# Patient Record
Sex: Female | Born: 1950 | Race: Black or African American | Hispanic: No | Marital: Married | Smoking: Never smoker
Health system: Southern US, Community
[De-identification: ages and names within clinical notes are randomized; demographics above are authoritative.]

## PROBLEM LIST (undated history)

## (undated) DIAGNOSIS — G4730 Sleep apnea, unspecified: Secondary | ICD-10-CM

## (undated) HISTORY — PX: APPENDECTOMY: SHX54

## (undated) HISTORY — PX: ABDOMINAL HYSTERECTOMY: SHX81

---

## 2007-01-02 ENCOUNTER — Encounter: Admission: RE | Admit: 2007-01-02 | Discharge: 2007-01-02 | Payer: Self-pay | Admitting: Internal Medicine

## 2008-02-19 ENCOUNTER — Encounter: Admission: RE | Admit: 2008-02-19 | Discharge: 2008-02-19 | Payer: Self-pay | Admitting: Internal Medicine

## 2009-03-02 ENCOUNTER — Encounter: Admission: RE | Admit: 2009-03-02 | Discharge: 2009-03-02 | Payer: Self-pay | Admitting: Internal Medicine

## 2010-03-15 ENCOUNTER — Encounter: Admission: RE | Admit: 2010-03-15 | Discharge: 2010-03-15 | Payer: Self-pay | Admitting: Internal Medicine

## 2011-02-12 ENCOUNTER — Other Ambulatory Visit: Payer: Self-pay | Admitting: Internal Medicine

## 2011-02-12 DIAGNOSIS — Z1231 Encounter for screening mammogram for malignant neoplasm of breast: Secondary | ICD-10-CM

## 2011-03-19 ENCOUNTER — Ambulatory Visit
Admission: RE | Admit: 2011-03-19 | Discharge: 2011-03-19 | Disposition: A | Discharge status: Home / Self Care | Payer: PRIVATE HEALTH INSURANCE | Source: Ambulatory Visit | Attending: Internal Medicine | Admitting: Internal Medicine

## 2011-03-19 DIAGNOSIS — Z1231 Encounter for screening mammogram for malignant neoplasm of breast: Secondary | ICD-10-CM

## 2011-11-12 ENCOUNTER — Emergency Department (HOSPITAL_COMMUNITY)
Admission: EM | Admit: 2011-11-12 | Discharge: 2011-11-12 | Disposition: A | Discharge status: Home / Self Care | Payer: PRIVATE HEALTH INSURANCE | Attending: Emergency Medicine | Admitting: Emergency Medicine

## 2011-11-12 ENCOUNTER — Encounter (HOSPITAL_COMMUNITY): Payer: Self-pay | Admitting: Emergency Medicine

## 2011-11-12 DIAGNOSIS — K644 Residual hemorrhoidal skin tags: Secondary | ICD-10-CM | POA: Insufficient documentation

## 2011-11-12 DIAGNOSIS — K649 Unspecified hemorrhoids: Secondary | ICD-10-CM

## 2011-11-12 DIAGNOSIS — G4730 Sleep apnea, unspecified: Secondary | ICD-10-CM | POA: Insufficient documentation

## 2011-11-12 DIAGNOSIS — K625 Hemorrhage of anus and rectum: Secondary | ICD-10-CM | POA: Insufficient documentation

## 2011-11-12 HISTORY — DX: Sleep apnea, unspecified: G47.30

## 2011-11-12 LAB — OCCULT BLOOD, POC DEVICE: Fecal Occult Bld: POSITIVE

## 2011-11-12 MED ORDER — LIDOCAINE-HYDROCORTISONE ACE 3-0.5 % RE CREA: TOPICAL_CREAM | RECTAL | Status: DC

## 2011-11-12 MED ORDER — HYDROCORTISONE ACE-PRAMOXINE 1-1 % RE FOAM: 1.0000 | Freq: Two times a day (BID) | RECTAL | Status: AC

## 2011-11-12 NOTE — ED Notes (Signed)
Patient complaining of rectal bleeding for the past to the three days; patient states that she has never had this problem in the past.  Patient reports color of blood as bright red and that she passed a couple of blood clots tonight (quarter-sized).  Patient denies being on any blood thinners at home.  Patient reports having constipation along with the rectal bleeding (reports hard, firm stools); last bowel movement two days ago.  Patient rates rectal pain 7/10 on the numerical pain scale; describes pain as "throbbing"; denies any abdominal pain.  Upon inspection, bowel sounds are present in all four quadrants.  Patient alert and oriented x4; PERRL present.  Upon arrival to room, patient changed into gown and connected to continuous cardiac, pulse ox, and blood pressure monitor.  Family present at bedside.  Will continue to monitor.

## 2011-11-12 NOTE — ED Notes (Signed)
C/o rectal bleeding with blood clots since Friday.  Reports mild rectal pain.

## 2011-11-12 NOTE — ED Provider Notes (Signed)
History     CSN: [id number]  Arrival date & time 11/12/11  0006   First MD Initiated Contact with Patient 11/12/11 [phone]      Chief Complaint  Patient presents with  . Rectal Bleeding    (Consider location/radiation/quality/duration/timing/severity/associated sxs/prior treatment) HPI Is a 61 year old black female who states she was constipated 3 days ago and passed a hard dark stool. Since that time she has had bleeding after bowel movements. She describes it as bright red blood not mixed with the stool. She passed 2 small clots yesterday evening and this prompted her to seek medical attention. She denies nausea, vomiting or diarrhea. She denies chest pain or shortness of breath. She denies lightheadedness. She does complain of a painful knot in her anus which she suspects is a hemorrhoid. The bleeding is described as mild to moderate.  Past Medical History  Diagnosis Date  . Sleep apnea     History reviewed. No pertinent past surgical history.  No family history on file.  History  Substance Use Topics  . Smoking status: Never Smoker   . Smokeless tobacco: Not on file  . Alcohol Use: No    OB History    Grav Para Term Preterm Abortions TAB SAB Ect Mult Living                  Review of Systems  All other systems reviewed and are negative.    Allergies  Review of patient's allergies indicates no known allergies.  Home Medications   Current Outpatient Rx  Name Route Sig Dispense Refill  . VITAMIN D 1000 UNITS PO TABS Oral Take 1,000 Units by mouth daily.    Marland Kitchen ESTROVEN PO Oral Take by mouth.    . OMEGA-3-ACID ETHYL ESTERS 1 G PO CAPS Oral Take 1 g by mouth daily.      BP 136/60  Pulse 75  Temp(Src) 97.7 F (36.5 C) (Oral)  Resp 16  SpO2 97%  Physical Exam General: Well-developed, well-nourished female in no acute distress; appearance consistent with age of record HENT: normocephalic, atraumatic Eyes: pupils equal round and reactive to light; extraocular  muscles intact; arcus senilis bilaterally Neck: supple Heart: regular rate and rhythm Lungs: clear to auscultation bilaterally Abdomen: soft; nondistended; nontender Rectal: Bleeding external hemorrhoid; dark heme-negative stool on examining glove Extremities: No deformity; full range of motion Neurologic: Awake, alert and oriented; motor function intact in all extremities and symmetric; no facial droop Skin: Warm and dry Psychiatric: Normal mood and affect    ED Course  Procedures (including critical care time) Anoscopy: A standard disposable plastic endoscope was lubricated and inserted into the rectum. The obturator was withdrawn. The rectal mucosa was of normal appearance without bleeding. There was dark brown stool seen in the vault. On slow withdrawal of the anoscope bleeding was noted from the patient's hemorrhoid.    MDM          Hanley Seamen, MD 11/12/11 [phone]

## 2011-11-12 NOTE — ED Notes (Signed)
Patient given discharge paperwork; went over discharge instructions with patient.  Instructed patient to follow up with [facility] if symptoms worsen, to apply rectal cream and foam to help with pain and hemorrhoids, and to return to the ED for new, worsening, or concerning symptoms.

## 2011-11-12 NOTE — Discharge Instructions (Signed)

## 2012-02-25 ENCOUNTER — Other Ambulatory Visit: Payer: Self-pay | Admitting: Internal Medicine

## 2012-02-25 DIAGNOSIS — Z1231 Encounter for screening mammogram for malignant neoplasm of breast: Secondary | ICD-10-CM

## 2012-03-19 ENCOUNTER — Ambulatory Visit
Admission: RE | Admit: 2012-03-19 | Discharge: 2012-03-19 | Disposition: A | Discharge status: Home / Self Care | Payer: PRIVATE HEALTH INSURANCE | Source: Ambulatory Visit | Attending: Internal Medicine | Admitting: Internal Medicine

## 2012-03-19 DIAGNOSIS — Z1231 Encounter for screening mammogram for malignant neoplasm of breast: Secondary | ICD-10-CM

## 2013-05-07 ENCOUNTER — Other Ambulatory Visit: Payer: Self-pay

## 2013-05-07 DIAGNOSIS — Z1231 Encounter for screening mammogram for malignant neoplasm of breast: Secondary | ICD-10-CM

## 2013-06-01 ENCOUNTER — Ambulatory Visit
Admission: RE | Admit: 2013-06-01 | Discharge: 2013-06-01 | Disposition: A | Discharge status: Home / Self Care | Payer: PRIVATE HEALTH INSURANCE | Source: Ambulatory Visit

## 2013-06-01 DIAGNOSIS — Z1231 Encounter for screening mammogram for malignant neoplasm of breast: Secondary | ICD-10-CM

## 2014-05-12 ENCOUNTER — Other Ambulatory Visit: Payer: Self-pay | Admitting: Internal Medicine

## 2014-05-12 ENCOUNTER — Ambulatory Visit
Admission: RE | Admit: 2014-05-12 | Discharge: 2014-05-12 | Disposition: A | Discharge status: Home / Self Care | Payer: PRIVATE HEALTH INSURANCE | Source: Ambulatory Visit | Attending: Internal Medicine | Admitting: Internal Medicine

## 2014-05-12 ENCOUNTER — Encounter (INDEPENDENT_AMBULATORY_CARE_PROVIDER_SITE_OTHER): Payer: Self-pay

## 2014-05-12 DIAGNOSIS — M25561 Pain in right knee: Secondary | ICD-10-CM

## 2016-05-16 ENCOUNTER — Other Ambulatory Visit: Payer: Self-pay | Admitting: Internal Medicine

## 2016-05-16 DIAGNOSIS — Z1231 Encounter for screening mammogram for malignant neoplasm of breast: Secondary | ICD-10-CM

## 2016-06-18 ENCOUNTER — Ambulatory Visit
Admission: RE | Admit: 2016-06-18 | Discharge: 2016-06-18 | Disposition: A | Discharge status: Home / Self Care | Payer: No Typology Code available for payment source | Source: Ambulatory Visit | Attending: Internal Medicine | Admitting: Internal Medicine

## 2016-06-18 DIAGNOSIS — Z1231 Encounter for screening mammogram for malignant neoplasm of breast: Secondary | ICD-10-CM

## 2017-04-18 DIAGNOSIS — H10413 Chronic giant papillary conjunctivitis, bilateral: Secondary | ICD-10-CM | POA: Diagnosis not present

## 2017-04-18 DIAGNOSIS — H2513 Age-related nuclear cataract, bilateral: Secondary | ICD-10-CM | POA: Diagnosis not present

## 2017-04-18 DIAGNOSIS — H04123 Dry eye syndrome of bilateral lacrimal glands: Secondary | ICD-10-CM | POA: Diagnosis not present

## 2017-04-26 DIAGNOSIS — H25812 Combined forms of age-related cataract, left eye: Secondary | ICD-10-CM | POA: Diagnosis not present

## 2017-04-26 DIAGNOSIS — H25811 Combined forms of age-related cataract, right eye: Secondary | ICD-10-CM | POA: Diagnosis not present

## 2017-04-26 DIAGNOSIS — H04123 Dry eye syndrome of bilateral lacrimal glands: Secondary | ICD-10-CM | POA: Diagnosis not present

## 2017-05-08 DIAGNOSIS — H25811 Combined forms of age-related cataract, right eye: Secondary | ICD-10-CM | POA: Diagnosis not present

## 2017-05-08 DIAGNOSIS — H2511 Age-related nuclear cataract, right eye: Secondary | ICD-10-CM | POA: Diagnosis not present

## 2017-05-08 DIAGNOSIS — H52221 Regular astigmatism, right eye: Secondary | ICD-10-CM | POA: Diagnosis not present

## 2017-05-17 DIAGNOSIS — Z13820 Encounter for screening for osteoporosis: Secondary | ICD-10-CM | POA: Diagnosis not present

## 2017-05-17 DIAGNOSIS — Z23 Encounter for immunization: Secondary | ICD-10-CM | POA: Diagnosis not present

## 2017-05-17 DIAGNOSIS — E669 Obesity, unspecified: Secondary | ICD-10-CM | POA: Diagnosis not present

## 2017-05-17 DIAGNOSIS — Z0000 Encounter for general adult medical examination without abnormal findings: Secondary | ICD-10-CM | POA: Diagnosis not present

## 2017-05-17 DIAGNOSIS — E7800 Pure hypercholesterolemia, unspecified: Secondary | ICD-10-CM | POA: Diagnosis not present

## 2017-05-17 DIAGNOSIS — G4733 Obstructive sleep apnea (adult) (pediatric): Secondary | ICD-10-CM | POA: Diagnosis not present

## 2017-05-17 DIAGNOSIS — Z6836 Body mass index (BMI) 36.0-36.9, adult: Secondary | ICD-10-CM | POA: Diagnosis not present

## 2017-05-22 DIAGNOSIS — Z13820 Encounter for screening for osteoporosis: Secondary | ICD-10-CM | POA: Diagnosis not present

## 2017-05-22 DIAGNOSIS — Z780 Asymptomatic menopausal state: Secondary | ICD-10-CM | POA: Diagnosis not present

## 2017-06-03 DIAGNOSIS — H2512 Age-related nuclear cataract, left eye: Secondary | ICD-10-CM | POA: Diagnosis not present

## 2017-06-05 DIAGNOSIS — H52222 Regular astigmatism, left eye: Secondary | ICD-10-CM | POA: Diagnosis not present

## 2017-06-05 DIAGNOSIS — H25812 Combined forms of age-related cataract, left eye: Secondary | ICD-10-CM | POA: Diagnosis not present

## 2017-06-05 DIAGNOSIS — H2512 Age-related nuclear cataract, left eye: Secondary | ICD-10-CM | POA: Diagnosis not present

## 2017-06-28 DIAGNOSIS — Z23 Encounter for immunization: Secondary | ICD-10-CM | POA: Diagnosis not present

## 2017-10-04 DIAGNOSIS — K591 Functional diarrhea: Secondary | ICD-10-CM | POA: Diagnosis not present

## 2017-10-04 DIAGNOSIS — Z1211 Encounter for screening for malignant neoplasm of colon: Secondary | ICD-10-CM | POA: Diagnosis not present

## 2017-11-06 DIAGNOSIS — D126 Benign neoplasm of colon, unspecified: Secondary | ICD-10-CM | POA: Diagnosis not present

## 2017-11-06 DIAGNOSIS — Z1211 Encounter for screening for malignant neoplasm of colon: Secondary | ICD-10-CM | POA: Diagnosis not present

## 2017-11-12 DIAGNOSIS — D126 Benign neoplasm of colon, unspecified: Secondary | ICD-10-CM | POA: Diagnosis not present

## 2018-01-13 DIAGNOSIS — Z961 Presence of intraocular lens: Secondary | ICD-10-CM | POA: Diagnosis not present

## 2018-01-13 DIAGNOSIS — H04123 Dry eye syndrome of bilateral lacrimal glands: Secondary | ICD-10-CM | POA: Diagnosis not present

## 2018-04-25 DIAGNOSIS — Z6835 Body mass index (BMI) 35.0-35.9, adult: Secondary | ICD-10-CM | POA: Diagnosis not present

## 2018-04-25 DIAGNOSIS — Z7189 Other specified counseling: Secondary | ICD-10-CM | POA: Diagnosis not present

## 2018-04-25 DIAGNOSIS — E7800 Pure hypercholesterolemia, unspecified: Secondary | ICD-10-CM | POA: Diagnosis not present

## 2018-04-25 DIAGNOSIS — G4733 Obstructive sleep apnea (adult) (pediatric): Secondary | ICD-10-CM | POA: Diagnosis not present

## 2018-04-25 DIAGNOSIS — Z23 Encounter for immunization: Secondary | ICD-10-CM | POA: Diagnosis not present

## 2018-04-25 DIAGNOSIS — Z1239 Encounter for other screening for malignant neoplasm of breast: Secondary | ICD-10-CM | POA: Diagnosis not present

## 2018-04-25 DIAGNOSIS — M79605 Pain in left leg: Secondary | ICD-10-CM | POA: Diagnosis not present

## 2018-04-25 DIAGNOSIS — E669 Obesity, unspecified: Secondary | ICD-10-CM | POA: Diagnosis not present

## 2018-04-25 DIAGNOSIS — Z0000 Encounter for general adult medical examination without abnormal findings: Secondary | ICD-10-CM | POA: Diagnosis not present

## 2018-06-23 DIAGNOSIS — Z23 Encounter for immunization: Secondary | ICD-10-CM | POA: Diagnosis not present

## 2018-09-25 DIAGNOSIS — B9789 Other viral agents as the cause of diseases classified elsewhere: Secondary | ICD-10-CM | POA: Diagnosis not present

## 2018-09-25 DIAGNOSIS — J069 Acute upper respiratory infection, unspecified: Secondary | ICD-10-CM | POA: Diagnosis not present

## 2018-10-14 DIAGNOSIS — M5432 Sciatica, left side: Secondary | ICD-10-CM | POA: Diagnosis not present

## 2018-10-29 ENCOUNTER — Other Ambulatory Visit: Payer: Self-pay | Admitting: Internal Medicine

## 2018-10-29 DIAGNOSIS — M5432 Sciatica, left side: Secondary | ICD-10-CM

## 2018-11-09 ENCOUNTER — Ambulatory Visit
Admission: RE | Admit: 2018-11-09 | Discharge: 2018-11-09 | Disposition: A | Discharge status: Home / Self Care | Payer: No Typology Code available for payment source | Source: Ambulatory Visit | Attending: Internal Medicine | Admitting: Internal Medicine

## 2018-11-09 DIAGNOSIS — M48061 Spinal stenosis, lumbar region without neurogenic claudication: Secondary | ICD-10-CM | POA: Diagnosis not present

## 2018-11-09 DIAGNOSIS — M5432 Sciatica, left side: Secondary | ICD-10-CM

## 2018-11-28 DIAGNOSIS — Z23 Encounter for immunization: Secondary | ICD-10-CM | POA: Diagnosis not present

## 2018-11-28 DIAGNOSIS — M47816 Spondylosis without myelopathy or radiculopathy, lumbar region: Secondary | ICD-10-CM | POA: Diagnosis not present

## 2018-11-28 DIAGNOSIS — M48061 Spinal stenosis, lumbar region without neurogenic claudication: Secondary | ICD-10-CM | POA: Diagnosis not present

## 2018-12-04 DIAGNOSIS — M4316 Spondylolisthesis, lumbar region: Secondary | ICD-10-CM | POA: Diagnosis not present

## 2018-12-15 DIAGNOSIS — R030 Elevated blood-pressure reading, without diagnosis of hypertension: Secondary | ICD-10-CM | POA: Diagnosis not present

## 2018-12-15 DIAGNOSIS — Z6834 Body mass index (BMI) 34.0-34.9, adult: Secondary | ICD-10-CM | POA: Diagnosis not present

## 2018-12-15 DIAGNOSIS — M5416 Radiculopathy, lumbar region: Secondary | ICD-10-CM | POA: Diagnosis not present

## 2018-12-15 DIAGNOSIS — M4316 Spondylolisthesis, lumbar region: Secondary | ICD-10-CM | POA: Diagnosis not present

## 2018-12-15 DIAGNOSIS — M48062 Spinal stenosis, lumbar region with neurogenic claudication: Secondary | ICD-10-CM | POA: Diagnosis not present

## 2018-12-24 DIAGNOSIS — Z6834 Body mass index (BMI) 34.0-34.9, adult: Secondary | ICD-10-CM | POA: Diagnosis not present

## 2018-12-24 DIAGNOSIS — M48062 Spinal stenosis, lumbar region with neurogenic claudication: Secondary | ICD-10-CM | POA: Diagnosis not present

## 2018-12-24 DIAGNOSIS — R030 Elevated blood-pressure reading, without diagnosis of hypertension: Secondary | ICD-10-CM | POA: Diagnosis not present

## 2018-12-24 DIAGNOSIS — M5416 Radiculopathy, lumbar region: Secondary | ICD-10-CM | POA: Diagnosis not present

## 2019-01-08 DIAGNOSIS — M4316 Spondylolisthesis, lumbar region: Secondary | ICD-10-CM | POA: Diagnosis not present

## 2019-01-08 DIAGNOSIS — M48062 Spinal stenosis, lumbar region with neurogenic claudication: Secondary | ICD-10-CM | POA: Diagnosis not present

## 2019-01-08 DIAGNOSIS — M5416 Radiculopathy, lumbar region: Secondary | ICD-10-CM | POA: Diagnosis not present

## 2019-01-20 DIAGNOSIS — M5416 Radiculopathy, lumbar region: Secondary | ICD-10-CM | POA: Diagnosis not present

## 2019-02-13 DIAGNOSIS — H04123 Dry eye syndrome of bilateral lacrimal glands: Secondary | ICD-10-CM | POA: Diagnosis not present

## 2019-02-13 DIAGNOSIS — H35413 Lattice degeneration of retina, bilateral: Secondary | ICD-10-CM | POA: Diagnosis not present

## 2019-02-13 DIAGNOSIS — Z961 Presence of intraocular lens: Secondary | ICD-10-CM | POA: Diagnosis not present

## 2019-03-25 DIAGNOSIS — R030 Elevated blood-pressure reading, without diagnosis of hypertension: Secondary | ICD-10-CM | POA: Diagnosis not present

## 2019-03-25 DIAGNOSIS — M5416 Radiculopathy, lumbar region: Secondary | ICD-10-CM | POA: Diagnosis not present

## 2019-03-25 DIAGNOSIS — Z6835 Body mass index (BMI) 35.0-35.9, adult: Secondary | ICD-10-CM | POA: Diagnosis not present

## 2019-04-30 DIAGNOSIS — Z1389 Encounter for screening for other disorder: Secondary | ICD-10-CM | POA: Diagnosis not present

## 2019-04-30 DIAGNOSIS — Z0000 Encounter for general adult medical examination without abnormal findings: Secondary | ICD-10-CM | POA: Diagnosis not present

## 2019-05-25 DIAGNOSIS — Z5181 Encounter for therapeutic drug level monitoring: Secondary | ICD-10-CM | POA: Diagnosis not present

## 2019-05-25 DIAGNOSIS — E7800 Pure hypercholesterolemia, unspecified: Secondary | ICD-10-CM | POA: Diagnosis not present

## 2019-05-25 DIAGNOSIS — M47816 Spondylosis without myelopathy or radiculopathy, lumbar region: Secondary | ICD-10-CM | POA: Diagnosis not present

## 2019-05-25 DIAGNOSIS — G4733 Obstructive sleep apnea (adult) (pediatric): Secondary | ICD-10-CM | POA: Diagnosis not present

## 2019-06-23 DIAGNOSIS — M5416 Radiculopathy, lumbar region: Secondary | ICD-10-CM | POA: Diagnosis not present

## 2019-06-23 DIAGNOSIS — M48062 Spinal stenosis, lumbar region with neurogenic claudication: Secondary | ICD-10-CM | POA: Diagnosis not present

## 2019-07-03 DIAGNOSIS — Z23 Encounter for immunization: Secondary | ICD-10-CM | POA: Diagnosis not present

## 2019-07-07 DIAGNOSIS — E7800 Pure hypercholesterolemia, unspecified: Secondary | ICD-10-CM | POA: Diagnosis not present

## 2019-07-07 DIAGNOSIS — Z5181 Encounter for therapeutic drug level monitoring: Secondary | ICD-10-CM | POA: Diagnosis not present

## 2019-07-14 DIAGNOSIS — R030 Elevated blood-pressure reading, without diagnosis of hypertension: Secondary | ICD-10-CM | POA: Diagnosis not present

## 2019-07-14 DIAGNOSIS — Z6835 Body mass index (BMI) 35.0-35.9, adult: Secondary | ICD-10-CM | POA: Diagnosis not present

## 2019-07-14 DIAGNOSIS — M48062 Spinal stenosis, lumbar region with neurogenic claudication: Secondary | ICD-10-CM | POA: Diagnosis not present

## 2019-07-14 DIAGNOSIS — M5416 Radiculopathy, lumbar region: Secondary | ICD-10-CM | POA: Diagnosis not present

## 2019-07-14 DIAGNOSIS — M4316 Spondylolisthesis, lumbar region: Secondary | ICD-10-CM | POA: Diagnosis not present

## 2019-08-14 ENCOUNTER — Other Ambulatory Visit: Payer: Self-pay | Admitting: Neurosurgery

## 2019-08-14 DIAGNOSIS — M4316 Spondylolisthesis, lumbar region: Secondary | ICD-10-CM | POA: Diagnosis not present

## 2019-09-02 NOTE — Progress Notes (Signed)
[facility] [place], [place] AT [facility] OF [address] [place] [address] Phone: [phone] Fax: [phone]  [id number] - [place], [place] [id number] [place] [id number] Phone: [phone] Fax: [phone]      Your procedure is scheduled on December 14  Report to [facility] Main Entrance "A" at 0730 A.M., and check in at the Admitting office.  Call this number if you have problems the morning of surgery:  [phone]  Call [phone] if you have any questions prior to your surgery date Monday-Friday 8am-4pm    Remember:  Do not eat or drink after midnight the night before your surgery     Take these medicines the morning of surgery with A SIP OF WATER  atorvastatin (LIPITOR) gabapentin (NEURONTIN)  Eye drops if needed   7 days prior to surgery STOP taking any Aspirin (unless otherwise instructed by your surgeon), Aleve, Naproxen, Ibuprofen, Motrin, Advil, Goody's, BC's, all herbal medications, fish oil, and all vitamins.    The Morning of Surgery  Do not wear jewelry, make-up or nail polish.  Do not wear lotions, powders, or perfumes/colognes, or deodorant  Do not shave 48 hours prior to surgery.    Do not bring valuables to the hospital.  [facility] is not responsible for any belongings or valuables.  If you are a smoker, DO NOT Smoke 24 hours prior to surgery  If you wear a CPAP at night please bring your mask, tubing, and machine the morning of surgery   Remember that you must have someone to transport you home after your surgery, and remain with you for 24 hours if you are discharged the same day.   Please bring cases for contacts, glasses, hearing aids, dentures or bridgework because it cannot be worn into surgery.    Leave your suitcase in the car.  After surgery it may be brought to your room.  For patients  admitted to the hospital, discharge time will be determined by your treatment team.  Patients discharged the day of surgery will not be allowed to drive home.    Special instructions:   [place]- Preparing For Surgery  Before surgery, you can play an important role. Because skin is not sterile, your skin needs to be as free of germs as possible. You can reduce the number of germs on your skin by washing with CHG (chlorahexidine gluconate) Soap before surgery.  CHG is an antiseptic cleaner which kills germs and bonds with the skin to continue killing germs even after washing.    Oral Hygiene is also important to reduce your risk of infection.  Remember - BRUSH YOUR TEETH THE MORNING OF SURGERY WITH YOUR REGULAR TOOTHPASTE  Please do not use if you have an allergy to CHG or antibacterial soaps. If your skin becomes reddened/irritated stop using the CHG.  Do not shave (including legs and underarms) for at least 48 hours prior to first CHG shower. It is OK to shave your face.  Please follow these instructions carefully.   1. Shower the NIGHT BEFORE SURGERY and the MORNING OF SURGERY with CHG Soap.   2. If you chose to wash your hair, wash your hair first as usual with your normal shampoo.  3. After you shampoo, rinse your hair and body thoroughly to remove the shampoo.  4. Use CHG as you would  any other liquid soap. You can apply CHG directly to the skin and wash gently with a scrungie or a clean washcloth.   5. Apply the CHG Soap to your body ONLY FROM THE NECK DOWN.  Do not use on open wounds or open sores. Avoid contact with your eyes, ears, mouth and genitals (private parts). Wash Face and genitals (private parts)  with your normal soap.   6. Wash thoroughly, paying special attention to the area where your surgery will be performed.  7. Thoroughly rinse your body with warm water from the neck down.  8. DO NOT shower/wash with your normal soap after using and rinsing off the CHG  Soap.  9. Pat yourself dry with a CLEAN TOWEL.  10. Wear CLEAN PAJAMAS to bed the night before surgery, wear comfortable clothes the morning of surgery  11. Place CLEAN SHEETS on your bed the night of your first shower and DO NOT SLEEP WITH PETS.    Day of Surgery:  Please shower the morning of surgery with the CHG soap Do not apply any deodorants/lotions. Please wear clean clothes to the hospital/surgery center.   Remember to brush your teeth WITH YOUR REGULAR TOOTHPASTE.   Please read over the following fact sheets that you were given.

## 2019-09-03 ENCOUNTER — Other Ambulatory Visit (HOSPITAL_COMMUNITY)
Admission: RE | Admit: 2019-09-03 | Discharge: 2019-09-03 | Disposition: A | Discharge status: Home / Self Care | Payer: Medicare Other | Source: Ambulatory Visit | Attending: Neurosurgery | Admitting: Neurosurgery

## 2019-09-03 ENCOUNTER — Other Ambulatory Visit: Payer: Self-pay

## 2019-09-03 ENCOUNTER — Encounter (HOSPITAL_COMMUNITY)
Admission: RE | Admit: 2019-09-03 | Discharge: 2019-09-03 | Disposition: A | Discharge status: Home / Self Care | Payer: Medicare Other | Source: Ambulatory Visit | Attending: Neurosurgery | Admitting: Neurosurgery

## 2019-09-03 ENCOUNTER — Encounter (HOSPITAL_COMMUNITY): Payer: Self-pay

## 2019-09-03 DIAGNOSIS — Z20828 Contact with and (suspected) exposure to other viral communicable diseases: Secondary | ICD-10-CM | POA: Diagnosis not present

## 2019-09-03 DIAGNOSIS — Z01812 Encounter for preprocedural laboratory examination: Secondary | ICD-10-CM | POA: Insufficient documentation

## 2019-09-03 LAB — BASIC METABOLIC PANEL
Anion gap: 9 (ref 5–15)
BUN: 10 mg/dL (ref 8–23)
CO2: 28 mmol/L (ref 22–32)
Calcium: 9.8 mg/dL (ref 8.9–10.3)
Chloride: 104 mmol/L (ref 98–111)
Creatinine, Ser: 0.89 mg/dL (ref 0.44–1.00)
GFR calc Af Amer: >60 mL/min (ref >60)
GFR calc non Af Amer: >60 mL/min (ref >60)
Glucose, Bld: 95 mg/dL (ref 70–99)
Potassium: 4.8 mmol/L (ref 3.5–5.1)
Sodium: 141 mmol/L (ref 135–145)

## 2019-09-03 LAB — CBC
HCT: 44 % (ref 36.0–46.0)
Hemoglobin: 14.2 g/dL (ref 12.0–15.0)
MCH: 29.6 pg (ref 26.0–34.0)
MCHC: 32.3 g/dL (ref 30.0–36.0)
MCV: 91.7 fL (ref 80.0–100.0)
Platelets: 243 10*3/uL (ref 150–400)
RBC: 4.8 MIL/uL (ref 3.87–5.11)
RDW: 14.6 % (ref 11.5–15.5)
WBC: 6 10*3/uL (ref 4.0–10.5)
nRBC: 0 % (ref 0.0–0.2)

## 2019-09-03 LAB — TYPE AND SCREEN
ABO/RH(D): A POS
Antibody Screen: NEGATIVE

## 2019-09-03 LAB — ABO/RH: ABO/RH(D): A POS

## 2019-09-03 LAB — SURGICAL PCR SCREEN
MRSA, PCR: NEGATIVE
Staphylococcus aureus: NEGATIVE

## 2019-09-03 NOTE — Progress Notes (Signed)
PCP:  Dr. Lavone Orn Cardiologist:  Denies  EKG:  N/A CXR:  N/A ECHO:  denies Stress Test:  denies Cardiac Cath:  denies  Covid test 09/03/19  Patient denies shortness of breath, fever, cough, and chest pain at PAT appointment.  Patient verbalized understanding of instructions provided today at the PAT appointment.  Patient asked to review instructions at home and day of surgery.

## 2019-09-04 LAB — NOVEL CORONAVIRUS, NAA (HOSP ORDER, SEND-OUT TO REF LAB; TAT 18-24 HRS): SARS-CoV-2, NAA: NOT DETECTED

## 2019-09-07 ENCOUNTER — Inpatient Hospital Stay (HOSPITAL_COMMUNITY): Payer: Medicare Other | Admitting: Anesthesiology

## 2019-09-07 ENCOUNTER — Inpatient Hospital Stay (HOSPITAL_COMMUNITY): Payer: Medicare Other

## 2019-09-07 ENCOUNTER — Inpatient Hospital Stay (HOSPITAL_COMMUNITY)
Admission: RE | Disposition: A | Discharge status: Home / Self Care | Payer: Self-pay | Source: Home / Self Care | Attending: Neurosurgery

## 2019-09-07 ENCOUNTER — Inpatient Hospital Stay (HOSPITAL_COMMUNITY)
Admission: RE | Admit: 2019-09-07 | Discharge: 2019-09-09 | DRG: 455 | Disposition: A | Discharge status: Home / Self Care | Payer: Medicare Other | Attending: Neurosurgery | Admitting: Neurosurgery

## 2019-09-07 ENCOUNTER — Encounter (HOSPITAL_COMMUNITY): Payer: Self-pay | Admitting: Neurosurgery

## 2019-09-07 ENCOUNTER — Other Ambulatory Visit: Payer: Self-pay

## 2019-09-07 DIAGNOSIS — Z90710 Acquired absence of both cervix and uterus: Secondary | ICD-10-CM

## 2019-09-07 DIAGNOSIS — M479 Spondylosis, unspecified: Secondary | ICD-10-CM | POA: Diagnosis present

## 2019-09-07 DIAGNOSIS — G4730 Sleep apnea, unspecified: Secondary | ICD-10-CM | POA: Diagnosis present

## 2019-09-07 DIAGNOSIS — M4316 Spondylolisthesis, lumbar region: Secondary | ICD-10-CM | POA: Diagnosis present

## 2019-09-07 DIAGNOSIS — Z419 Encounter for procedure for purposes other than remedying health state, unspecified: Secondary | ICD-10-CM

## 2019-09-07 DIAGNOSIS — M48061 Spinal stenosis, lumbar region without neurogenic claudication: Secondary | ICD-10-CM | POA: Diagnosis present

## 2019-09-07 DIAGNOSIS — Z79899 Other long term (current) drug therapy: Secondary | ICD-10-CM

## 2019-09-07 SURGERY — POSTERIOR LUMBAR FUSION 2 LEVEL
Anesthesia: General | Site: Back

## 2019-09-07 MED ORDER — ONDANSETRON HCL 4 MG PO TABS: 4.0000 mg | ORAL_TABLET | Freq: Four times a day (QID) | ORAL | Status: DC | PRN

## 2019-09-07 MED ORDER — ALUM & MAG HYDROXIDE-SIMETH 200-200-20 MG/5ML PO SUSP: 30.0000 mL | Freq: Four times a day (QID) | ORAL | Status: DC | PRN

## 2019-09-07 MED ORDER — THROMBIN 20000 UNITS EX SOLR
CUTANEOUS | Status: DC | PRN
  Administered 2019-09-07: 20 mL via TOPICAL
  Administered 2019-09-07: 13:00:00 via TOPICAL

## 2019-09-07 MED ORDER — PHENOL 1.4 % MT LIQD: 1.0000 | OROMUCOSAL | Status: DC | PRN

## 2019-09-07 MED ORDER — THROMBIN 20000 UNITS EX SOLR
CUTANEOUS | Status: AC
  Filled 2019-09-07: qty 20000

## 2019-09-07 MED ORDER — HYDROMORPHONE HCL 1 MG/ML IJ SOLN: 0.5000 mg | INTRAMUSCULAR | Status: DC | PRN

## 2019-09-07 MED ORDER — LIDOCAINE-EPINEPHRINE 1 %-1:100000 IJ SOLN
INTRAMUSCULAR | Status: AC
  Filled 2019-09-07: qty 1

## 2019-09-07 MED ORDER — LACTATED RINGERS IV SOLN
INTRAVENOUS | Status: DC
  Administered 2019-09-07: 11:00:00 via INTRAVENOUS

## 2019-09-07 MED ORDER — ARTHREX ANGEL - ACD-A SOLUTION (CHARTING ONLY) OPTIME
TOPICAL | Status: DC | PRN
  Administered 2019-09-07: 10 mL via TOPICAL

## 2019-09-07 MED ORDER — HYDROMORPHONE HCL 1 MG/ML IJ SOLN
0.2500 mg | INTRAMUSCULAR | Status: DC | PRN
  Administered 2019-09-07: 0.5 mg via INTRAVENOUS

## 2019-09-07 MED ORDER — DEXAMETHASONE SODIUM PHOSPHATE 10 MG/ML IJ SOLN
INTRAMUSCULAR | Status: DC | PRN
  Administered 2019-09-07: 10 mg via INTRAVENOUS

## 2019-09-07 MED ORDER — FENTANYL CITRATE (PF) 100 MCG/2ML IJ SOLN
INTRAMUSCULAR | Status: DC | PRN
  Administered 2019-09-07: 100 ug via INTRAVENOUS
  Administered 2019-09-07 (×2): 50 ug via INTRAVENOUS

## 2019-09-07 MED ORDER — LACTATED RINGERS IV SOLN
INTRAVENOUS | Status: DC | PRN
  Administered 2019-09-07 (×4): via INTRAVENOUS

## 2019-09-07 MED ORDER — PHENYLEPHRINE HCL-NACL 10-0.9 MG/250ML-% IV SOLN
INTRAVENOUS | Status: DC | PRN
  Administered 2019-09-07: 25 ug/min via INTRAVENOUS

## 2019-09-07 MED ORDER — GABAPENTIN 300 MG PO CAPS
300.0000 mg | ORAL_CAPSULE | Freq: Every day | ORAL | Status: DC
  Administered 2019-09-07 – 2019-09-08 (×2): 300 mg via ORAL
  Filled 2019-09-07: qty 1
  Filled 2019-09-07: qty 3

## 2019-09-07 MED ORDER — THROMBIN 5000 UNITS EX SOLR
CUTANEOUS | Status: AC
  Filled 2019-09-07: qty 5000

## 2019-09-07 MED ORDER — CYCLOBENZAPRINE HCL 10 MG PO TABS
10.0000 mg | ORAL_TABLET | Freq: Three times a day (TID) | ORAL | Status: DC | PRN
  Administered 2019-09-07 – 2019-09-08 (×2): 10 mg via ORAL
  Filled 2019-09-07 (×2): qty 1

## 2019-09-07 MED ORDER — MENTHOL 3 MG MT LOZG: 1.0000 | LOZENGE | OROMUCOSAL | Status: DC | PRN

## 2019-09-07 MED ORDER — ROCURONIUM 10MG/ML (10ML) SYRINGE FOR MEDFUSION PUMP - OPTIME
INTRAVENOUS | Status: DC | PRN
  Administered 2019-09-07: 10 mg via INTRAVENOUS
  Administered 2019-09-07: 50 mg via INTRAVENOUS

## 2019-09-07 MED ORDER — POLYVINYL ALCOHOL 1.4 % OP SOLN
1.0000 [drp] | Freq: Every day | OPHTHALMIC | Status: DC
  Administered 2019-09-08 – 2019-09-09 (×2): 1 [drp] via OPHTHALMIC
  Filled 2019-09-07: qty 15

## 2019-09-07 MED ORDER — ATORVASTATIN CALCIUM 10 MG PO TABS
10.0000 mg | ORAL_TABLET | Freq: Every day | ORAL | Status: DC
  Administered 2019-09-08 – 2019-09-09 (×2): 10 mg via ORAL
  Filled 2019-09-07 (×2): qty 1

## 2019-09-07 MED ORDER — PROPOFOL 10 MG/ML IV BOLUS
INTRAVENOUS | Status: AC
  Filled 2019-09-07: qty 20

## 2019-09-07 MED ORDER — BUPIVACAINE HCL (PF) 0.25 % IJ SOLN
INTRAMUSCULAR | Status: AC
  Filled 2019-09-07: qty 30

## 2019-09-07 MED ORDER — SODIUM CHLORIDE 0.9% FLUSH: 3.0000 mL | INTRAVENOUS | Status: DC | PRN

## 2019-09-07 MED ORDER — HYDROMORPHONE HCL 1 MG/ML IJ SOLN
INTRAMUSCULAR | Status: AC
  Administered 2019-09-07: 0.5 mg via INTRAVENOUS
  Filled 2019-09-07: qty 1

## 2019-09-07 MED ORDER — ALBUMIN HUMAN 5 % IV SOLN
INTRAVENOUS | Status: DC | PRN
  Administered 2019-09-07: 15:00:00 via INTRAVENOUS

## 2019-09-07 MED ORDER — LIDOCAINE-EPINEPHRINE 1 %-1:100000 IJ SOLN
INTRAMUSCULAR | Status: DC | PRN
  Administered 2019-09-07: 10 mL

## 2019-09-07 MED ORDER — SODIUM CHLORIDE (PF) 0.9 % IJ SOLN
INTRAMUSCULAR | Status: DC | PRN
  Administered 2019-09-07: 5 mL

## 2019-09-07 MED ORDER — MIDAZOLAM HCL 5 MG/5ML IJ SOLN
INTRAMUSCULAR | Status: DC | PRN
  Administered 2019-09-07: 1 mg via INTRAVENOUS

## 2019-09-07 MED ORDER — ACETAMINOPHEN 500 MG PO TABS: 1000.0000 mg | ORAL_TABLET | Freq: Once | ORAL | Status: DC

## 2019-09-07 MED ORDER — PANTOPRAZOLE SODIUM 40 MG PO TBEC
40.0000 mg | DELAYED_RELEASE_TABLET | Freq: Every day | ORAL | Status: DC
  Administered 2019-09-07 – 2019-09-09 (×3): 40 mg via ORAL
  Filled 2019-09-07 (×3): qty 1

## 2019-09-07 MED ORDER — BUPIVACAINE LIPOSOME 1.3 % IJ SUSP
INTRAMUSCULAR | Status: DC | PRN
  Administered 2019-09-07: 20 mL

## 2019-09-07 MED ORDER — OXYCODONE HCL 5 MG PO TABS
10.0000 mg | ORAL_TABLET | ORAL | Status: DC | PRN
  Administered 2019-09-07 – 2019-09-08 (×2): 10 mg via ORAL
  Filled 2019-09-07 (×2): qty 2

## 2019-09-07 MED ORDER — HEPARIN SODIUM (PORCINE) 1000 UNIT/ML IJ SOLN
INTRAMUSCULAR | Status: DC | PRN
  Administered 2019-09-07: 5000 [IU]

## 2019-09-07 MED ORDER — PROPOFOL 10 MG/ML IV BOLUS
INTRAVENOUS | Status: DC | PRN
  Administered 2019-09-07: 120 ug via INTRAVENOUS

## 2019-09-07 MED ORDER — 0.9 % SODIUM CHLORIDE (POUR BTL) OPTIME
TOPICAL | Status: DC | PRN
  Administered 2019-09-07: 1000 mL

## 2019-09-07 MED ORDER — BIOTIN 2500 MCG PO CAPS: 2500.0000 ug | ORAL_CAPSULE | Freq: Every day | ORAL | Status: DC

## 2019-09-07 MED ORDER — OMEGA-3-ACID ETHYL ESTERS 1 G PO CAPS
1.0000 g | ORAL_CAPSULE | Freq: Every day | ORAL | Status: DC
  Administered 2019-09-08 – 2019-09-09 (×2): 1 g via ORAL
  Filled 2019-09-07 (×3): qty 1

## 2019-09-07 MED ORDER — CHLORHEXIDINE GLUCONATE CLOTH 2 % EX PADS: 6.0000 | MEDICATED_PAD | Freq: Once | CUTANEOUS | Status: DC

## 2019-09-07 MED ORDER — SODIUM CHLORIDE 0.9 % IV SOLN
INTRAVENOUS | Status: DC | PRN
  Administered 2019-09-07: 500 mL

## 2019-09-07 MED ORDER — SUCCINYLCHOLINE CHLORIDE 20 MG/ML IJ SOLN
INTRAMUSCULAR | Status: DC | PRN
  Administered 2019-09-07: 100 mg via INTRAVENOUS

## 2019-09-07 MED ORDER — ONDANSETRON HCL 4 MG/2ML IJ SOLN
4.0000 mg | Freq: Four times a day (QID) | INTRAMUSCULAR | Status: DC | PRN
  Administered 2019-09-07: 4 mg via INTRAVENOUS
  Filled 2019-09-07: qty 2

## 2019-09-07 MED ORDER — RISAQUAD PO CAPS
1.0000 | ORAL_CAPSULE | Freq: Every day | ORAL | Status: DC
  Administered 2019-09-08 – 2019-09-09 (×2): 1 via ORAL
  Filled 2019-09-07 (×3): qty 1

## 2019-09-07 MED ORDER — BUPIVACAINE LIPOSOME 1.3 % IJ SUSP
20.0000 mL | Freq: Once | INTRAMUSCULAR | Status: DC
  Filled 2019-09-07: qty 20

## 2019-09-07 MED ORDER — ONDANSETRON HCL 4 MG/2ML IJ SOLN
INTRAMUSCULAR | Status: DC | PRN
  Administered 2019-09-07: 4 mg via INTRAVENOUS

## 2019-09-07 MED ORDER — DEXAMETHASONE SODIUM PHOSPHATE 10 MG/ML IJ SOLN
10.0000 mg | Freq: Once | INTRAMUSCULAR | Status: DC
  Filled 2019-09-07: qty 1

## 2019-09-07 MED ORDER — CEFAZOLIN SODIUM-DEXTROSE 2-4 GM/100ML-% IV SOLN
2.0000 g | INTRAVENOUS | Status: AC
  Administered 2019-09-07 (×2): 2 g via INTRAVENOUS
  Filled 2019-09-07: qty 100

## 2019-09-07 MED ORDER — HEPARIN SODIUM (PORCINE) 1000 UNIT/ML IJ SOLN
INTRAMUSCULAR | Status: AC
  Filled 2019-09-07: qty 1

## 2019-09-07 MED ORDER — SODIUM CHLORIDE 0.9% FLUSH
3.0000 mL | Freq: Two times a day (BID) | INTRAVENOUS | Status: DC
  Administered 2019-09-08: 3 mL via INTRAVENOUS

## 2019-09-07 MED ORDER — CEFAZOLIN SODIUM-DEXTROSE 2-4 GM/100ML-% IV SOLN
2.0000 g | Freq: Three times a day (TID) | INTRAVENOUS | Status: DC
  Administered 2019-09-08 (×4): 2 g via INTRAVENOUS
  Filled 2019-09-07 (×4): qty 100

## 2019-09-07 MED ORDER — MIDAZOLAM HCL 2 MG/2ML IJ SOLN
INTRAMUSCULAR | Status: AC
  Filled 2019-09-07: qty 2

## 2019-09-07 MED ORDER — SODIUM CHLORIDE 0.9 % IV SOLN: 250.0000 mL | INTRAVENOUS | Status: DC

## 2019-09-07 MED ORDER — SUGAMMADEX SODIUM 200 MG/2ML IV SOLN
INTRAVENOUS | Status: DC | PRN
  Administered 2019-09-07: 200 mg via INTRAVENOUS

## 2019-09-07 MED ORDER — ALIGN 4 MG PO CAPS: 4.0000 mg | ORAL_CAPSULE | Freq: Every day | ORAL | Status: DC

## 2019-09-07 MED ORDER — POLYETHYL GLYCOL-PROPYL GLYCOL 0.4-0.3 % OP GEL: Freq: Every day | OPHTHALMIC | Status: DC

## 2019-09-07 MED ORDER — ACETAMINOPHEN 325 MG PO TABS
650.0000 mg | ORAL_TABLET | ORAL | Status: DC | PRN
  Administered 2019-09-08: 650 mg via ORAL
  Filled 2019-09-07: qty 2

## 2019-09-07 MED ORDER — FENTANYL CITRATE (PF) 250 MCG/5ML IJ SOLN
INTRAMUSCULAR | Status: AC
  Filled 2019-09-07: qty 5

## 2019-09-07 MED ORDER — PANTOPRAZOLE SODIUM 40 MG IV SOLR: 40.0000 mg | Freq: Every day | INTRAVENOUS | Status: DC

## 2019-09-07 MED ORDER — LIDOCAINE HCL (CARDIAC) PF 100 MG/5ML IV SOSY
PREFILLED_SYRINGE | INTRAVENOUS | Status: DC | PRN
  Administered 2019-09-07: 30 mg via INTRATRACHEAL

## 2019-09-07 MED ORDER — ACETAMINOPHEN 650 MG RE SUPP: 650.0000 mg | RECTAL | Status: DC | PRN

## 2019-09-07 SURGICAL SUPPLY — 97 items
ADH SKN CLS APL DERMABOND .7 (GAUZE/BANDAGES/DRESSINGS) ×1
APL SKNCLS STERI-STRIP NONHPOA (GAUZE/BANDAGES/DRESSINGS) ×1
BAG DECANTER FOR FLEXI CONT (MISCELLANEOUS) ×3 IMPLANT
BENZOIN TINCTURE PRP APPL 2/3 (GAUZE/BANDAGES/DRESSINGS) ×3 IMPLANT
BLADE CLIPPER SURG (BLADE) IMPLANT
BLADE SURG 11 STRL SS (BLADE) ×3 IMPLANT
BUR CUTTER 7.0 ROUND (BURR) ×3 IMPLANT
BUR MATCHSTICK NEURO 3.0 LAGG (BURR) ×3 IMPLANT
CANISTER SUCT 3000ML PPV (MISCELLANEOUS) ×3 IMPLANT
CAP LOCKING (Cap) ×6 IMPLANT
CAP LOCKING 5.5 CREO (Cap) IMPLANT
CAP LOCKING THREADED (Cap) ×6 IMPLANT
CARTRIDGE OIL MAESTRO DRILL (MISCELLANEOUS) ×1 IMPLANT
CLOSURE WOUND 1/2 X4 (GAUZE/BANDAGES/DRESSINGS) ×1
CONT SPEC 4OZ CLIKSEAL STRL BL (MISCELLANEOUS) ×3 IMPLANT
COVER BACK TABLE 60X90IN (DRAPES) ×3 IMPLANT
COVER WAND RF STERILE (DRAPES) ×3 IMPLANT
DECANTER SPIKE VIAL GLASS SM (MISCELLANEOUS) ×3 IMPLANT
DERMABOND ADVANCED (GAUZE/BANDAGES/DRESSINGS) ×2
DERMABOND ADVANCED .7 DNX12 (GAUZE/BANDAGES/DRESSINGS) ×1 IMPLANT
DIFFUSER DRILL AIR PNEUMATIC (MISCELLANEOUS) ×3 IMPLANT
DRAPE C-ARM 42X72 X-RAY (DRAPES) ×3 IMPLANT
DRAPE C-ARMOR (DRAPES) ×2 IMPLANT
DRAPE HALF SHEET 40X57 (DRAPES) IMPLANT
DRAPE LAPAROTOMY 100X72X124 (DRAPES) ×3 IMPLANT
DRAPE SURG 17X23 STRL (DRAPES) ×3 IMPLANT
DRSG OPSITE 4X5.5 SM (GAUZE/BANDAGES/DRESSINGS) ×1 IMPLANT
DRSG OPSITE POSTOP 3X4 (GAUZE/BANDAGES/DRESSINGS) ×2 IMPLANT
DRSG OPSITE POSTOP 4X6 (GAUZE/BANDAGES/DRESSINGS) ×1 IMPLANT
DRSG OPSITE POSTOP 4X8 (GAUZE/BANDAGES/DRESSINGS) ×2 IMPLANT
DURAPREP 26ML APPLICATOR (WOUND CARE) ×3 IMPLANT
ELECT REM PT RETURN 9FT ADLT (ELECTROSURGICAL) ×3
ELECTRODE REM PT RTRN 9FT ADLT (ELECTROSURGICAL) ×1 IMPLANT
EVACUATOR 1/8 PVC DRAIN (DRAIN) ×2 IMPLANT
EVACUATOR 3/16  PVC DRAIN (DRAIN) ×2
EVACUATOR 3/16 PVC DRAIN (DRAIN) ×1 IMPLANT
GAUZE 4X4 16PLY RFD (DISPOSABLE) ×2 IMPLANT
GAUZE SPONGE 4X4 12PLY STRL (GAUZE/BANDAGES/DRESSINGS) ×3 IMPLANT
GAUZE SPONGE 4X4 12PLY STRL LF (GAUZE/BANDAGES/DRESSINGS) ×2 IMPLANT
GLOVE BIO SURGEON STRL SZ 6.5 (GLOVE) ×4 IMPLANT
GLOVE BIO SURGEON STRL SZ7 (GLOVE) ×2 IMPLANT
GLOVE BIO SURGEON STRL SZ8 (GLOVE) ×6 IMPLANT
GLOVE BIO SURGEONS STRL SZ 6.5 (GLOVE) ×4
GLOVE BIOGEL PI IND STRL 6.5 (GLOVE) IMPLANT
GLOVE BIOGEL PI IND STRL 7.0 (GLOVE) IMPLANT
GLOVE BIOGEL PI IND STRL 8 (GLOVE) IMPLANT
GLOVE BIOGEL PI INDICATOR 6.5 (GLOVE) ×2
GLOVE BIOGEL PI INDICATOR 7.0 (GLOVE) ×6
GLOVE BIOGEL PI INDICATOR 8 (GLOVE) ×4
GLOVE EXAM NITRILE XL STR (GLOVE) IMPLANT
GLOVE INDICATOR 8.5 STRL (GLOVE) ×6 IMPLANT
GLOVE SURG SS PI 7.0 STRL IVOR (GLOVE) ×4 IMPLANT
GLOVE SURG SS PI 7.5 STRL IVOR (GLOVE) ×8 IMPLANT
GOWN STRL REUS W/ TWL LRG LVL3 (GOWN DISPOSABLE) IMPLANT
GOWN STRL REUS W/ TWL XL LVL3 (GOWN DISPOSABLE) ×2 IMPLANT
GOWN STRL REUS W/TWL 2XL LVL3 (GOWN DISPOSABLE) IMPLANT
GOWN STRL REUS W/TWL LRG LVL3 (GOWN DISPOSABLE) ×9
GOWN STRL REUS W/TWL XL LVL3 (GOWN DISPOSABLE) ×12
HEMOSTAT POWDER KIT SURGIFOAM (HEMOSTASIS) IMPLANT
KIT BASIN OR (CUSTOM PROCEDURE TRAY) ×3 IMPLANT
KIT BONE MRW ASP ANGEL CPRP (KITS) ×2 IMPLANT
KIT POSITION SURG JACKSON T1 (MISCELLANEOUS) ×1 IMPLANT
KIT TURNOVER KIT B (KITS) ×3 IMPLANT
MILL MEDIUM DISP (BLADE) ×3 IMPLANT
NDL HYPO 21X1.5 SAFETY (NEEDLE) ×1 IMPLANT
NDL HYPO 25X1 1.5 SAFETY (NEEDLE) ×1 IMPLANT
NEEDLE HYPO 21X1.5 SAFETY (NEEDLE) ×3 IMPLANT
NEEDLE HYPO 25X1 1.5 SAFETY (NEEDLE) ×3 IMPLANT
NS IRRIG 1000ML POUR BTL (IV SOLUTION) ×3 IMPLANT
OIL CARTRIDGE MAESTRO DRILL (MISCELLANEOUS) ×3
PACK LAMINECTOMY NEURO (CUSTOM PROCEDURE TRAY) ×3 IMPLANT
PAD ARMBOARD 7.5X6 YLW CONV (MISCELLANEOUS) ×3 IMPLANT
PATTIES SURGICAL 1X1 (DISPOSABLE) ×2 IMPLANT
PUTTY DBM ALLOSYNC PURE 10CC (Putty) ×2 IMPLANT
ROD 55MM (Rod) ×3 IMPLANT
ROD 65MM SPINAL (Rod) ×2 IMPLANT
ROD SPNL 5.5 CREO TI 65 (Rod) IMPLANT
ROD SPNL CVD 55X5.5XNS TI (Rod) IMPLANT
SCREW 6.5X5.5 30MM (Screw) ×4 IMPLANT
SCREW CREO 5.5 4.0/5.0X30 (Screw) ×4 IMPLANT
SCREW PA THRD CREO TULIP 5.5X4 (Head) ×6 IMPLANT
SHAFT CREO 30MM (Neuro Prosthesis/Implant) ×4 IMPLANT
SPACER SUSTAIN TI 9X22X12 15D (Spacer) ×4 IMPLANT
SPACER SUSTAIN TI 9X22X12 8D (Spacer) ×4 IMPLANT
SPONGE LAP 4X18 RFD (DISPOSABLE) IMPLANT
SPONGE SURGIFOAM ABS GEL 100 (HEMOSTASIS) ×5 IMPLANT
STRIP CLOSURE SKIN 1/2X4 (GAUZE/BANDAGES/DRESSINGS) ×3 IMPLANT
SUT VIC AB 0 CT1 18XCR BRD8 (SUTURE) ×1 IMPLANT
SUT VIC AB 0 CT1 8-18 (SUTURE) ×3
SUT VIC AB 2-0 CT1 18 (SUTURE) ×5 IMPLANT
SUT VIC AB 4-0 PS2 27 (SUTURE) ×3 IMPLANT
SYR 20ML LL LF (SYRINGE) ×3 IMPLANT
TOWEL GREEN STERILE (TOWEL DISPOSABLE) ×3 IMPLANT
TOWEL GREEN STERILE FF (TOWEL DISPOSABLE) ×3 IMPLANT
TRAY FOLEY MTR SLVR 16FR STAT (SET/KITS/TRAYS/PACK) ×3 IMPLANT
TULIP CREP AMP 5.5MM (Orthopedic Implant) ×6 IMPLANT
WATER STERILE IRR 1000ML POUR (IV SOLUTION) ×3 IMPLANT

## 2019-09-07 NOTE — Transfer of Care (Signed)
Immediate Anesthesia Transfer of Care Note  Patient: Sharon Horne  Procedure(s) Performed: Posterior Lumbar Interbody Fusion - Lumbar four-Lumbar five- Lumbar five-Sacral one (N/A Back)  Patient Location: PACU  Anesthesia Type:General  Level of Consciousness: awake, alert  and oriented  Airway & Oxygen Therapy: Patient Spontanous Breathing and Patient connected to nasal cannula oxygen  Post-op Assessment: Report given to RN and Post -op Vital signs reviewed and stable  Post vital signs: Reviewed and stable  Last Vitals:  Vitals Value Taken Time  BP 131/81 09/07/19 1727  Temp 36.7 C 09/07/19 1727  Pulse 86 09/07/19 1736  Resp 16 09/07/19 1736  SpO2 100 % 09/07/19 1736  Vitals shown include unvalidated device data.  Last Pain:  Vitals:   09/07/19 1733  TempSrc:   PainSc: 9       Patients Stated Pain Goal: 4 ([id number])  Complications: No apparent anesthesia complications

## 2019-09-07 NOTE — Anesthesia Preprocedure Evaluation (Addendum)
Anesthesia Evaluation  Patient identified by MRN, date of birth, ID band Patient awake    Reviewed: Allergy & Precautions, H&P , NPO status , Patient's Chart, lab work & pertinent test results  Airway Mallampati: II  TM Distance: >3 FB Neck ROM: Full    Dental no notable dental hx. (+) Teeth Intact, Dental Advisory Given   Pulmonary sleep apnea and Continuous Positive Airway Pressure Ventilation ,    Pulmonary exam normal breath sounds clear to auscultation       Cardiovascular negative cardio ROS   Rhythm:Regular Rate:Normal     Neuro/Psych negative neurological ROS  negative psych ROS   GI/Hepatic negative GI ROS, Neg liver ROS,   Endo/Other  negative endocrine ROS  Renal/GU negative Renal ROS  negative genitourinary   Musculoskeletal   Abdominal   Peds  Hematology negative hematology ROS (+)   Anesthesia Other Findings   Reproductive/Obstetrics negative OB ROS                            Anesthesia Physical Anesthesia Plan  ASA: III  Anesthesia Plan: General   Post-op Pain Management:    Induction: Intravenous  PONV Risk Score and Plan: 4 or greater and Ondansetron, Dexamethasone and Midazolam  Airway Management Planned: Oral ETT  Additional Equipment:   Intra-op Plan:   Post-operative Plan: Extubation in OR  Informed Consent: I have reviewed the patients History and Physical, chart, labs and discussed the procedure including the risks, benefits and alternatives for the proposed anesthesia with the patient or authorized representative who has indicated his/her understanding and acceptance.     Dental advisory given  Plan Discussed with: CRNA  Anesthesia Plan Comments:         Anesthesia Quick Evaluation

## 2019-09-07 NOTE — Anesthesia Procedure Notes (Signed)
Procedure Name: Intubation Date/Time: 09/07/2019 12:57 PM Performed by: Eligha Bridegroom, CRNA Pre-anesthesia Checklist: Patient identified, Emergency Drugs available, Suction available and Patient being monitored Patient Re-evaluated:Patient Re-evaluated prior to induction Oxygen Delivery Method: Circle system utilized Preoxygenation: Pre-oxygenation with 100% oxygen Induction Type: IV induction Ventilation: Mask ventilation without difficulty Laryngoscope Size: Mac and 4 Grade View: Grade III Tube type: Oral Tube size: 7.0 mm Number of attempts: 1 Airway Equipment and Method: Stylet Placement Confirmation: ETT inserted through vocal cords under direct vision,  positive ETCO2 and breath sounds checked- equal and bilateral Secured at: 20 cm Tube secured with: Tape Dental Injury: Teeth and Oropharynx as per pre-operative assessment

## 2019-09-07 NOTE — Op Note (Signed)
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  Postoperative diagnosis: Same  Procedure #1 decompressive lumbar laminectomy L4-5 L5-S1 with complete medial facetectomies and radical foraminotomies of the L4, L5, and S1 nerve roots in excess and requiring more work than would be needed with a standard interbody fusion.  2.  Posterior lumbar interbody fusion utilizing the globus insert and rotate titanium cages packed with locally harvested autograft mixed with Glenard Haring pure with bone marrow aspirate And alternatives of surgery 3.  Cortical screw fixation L4-S1 utilizing the globus Creo amp modular cortical screw set and we had to use a combination of the screw type caps as well as the insert locking caps  4.  Posterior lateral arthrodesis L4-S1 utilizing locally harvested autograft mixed with the [facility] pure bone marrow aspirate concentrate  5.  Open reduction spinal deformity L4-5 L5-S1  Surgeon: Dominica Severin Saafir Abdullah  Assistant: Nash Shearer  Anesthesia: General  EBL: Minimal  HPI: 68 year old female is a longstanding back and bilateral hip and leg pain work-up revealed severe spinal stenosis and grade 1 spondylolisthesis at both levels.  Due to patient's progression of clinical syndrome imaging findings and failed conservative treatment I recommended decompressive lumbar laminectomies and interbody fusions at L4-5 and L5-S1.  I extensively reviewed the risks and benefits of that operation with her as well as perioperative course expectations of outcome and alternatives of surgery and she understood and agreed to proceed forward.  Operative procedure: Patient was brought into the OR was due to general anesthesia positioned prone the Wilson frame her back was prepped and draped in routine sterile fashion after infiltration of 10 cc lidocaine with epi midline incision was made and Bovie elect cautery was used  take down the subcutaneous tissue and subperiosteal dissection was carried lamina of L4-L5 and S1 bilaterally.  Intraoperative x-ray confirmed defecation appropriate level so the spinous process of L4 and L5 were removed central decompression was begun I performed complete medial facetectomies at L4 and L5 bilaterally there was marked hourglass compression of thecal sac especially at L4 and an extensive mount of granulation tissue and scar tissue.  I extensively under bit the supra articulating facet at both levels to gain access to the lateral margin of the space.  After adequate decompression right foraminotomies have been achieved at L4-L5 and S1 attention was taken to the interbody work disc base was prepared bilaterally cleaned out bilaterally and with sequential distraction place I selected 9 mm with 12 mm tall 15 degree lordotic cages for L4-5 and 8 degree lordotic cages for L5-S1 after adequate discectomy been performed with sequential distraction the deformity was reduced significantly and inserted both cages with an extensive amount of autograft centrally.  After all the interbody work been done cortical screws were placed under fluoroscopy postop fluoroscopy confirmed good position of all the implants.  Heads were assembled screws were advanced everything was anchored in place with the L4 screw compressed against L5 and the L5 compressed against S1.  I then decorticated the residual of the facet joints and packed posterior lateral bone along the facet joints from L4 down S1.  Inspected on my foramina to confirm patency and no migration of graft material leg Gelfoam of the dura injected the fascia with Exparel placed a medium Hemovac drain and closed the wound in layers with Vicryl in a running 4 subcuticular.  Dermabond benzoin Steri-Strips and a sterile dressing was applied patient cover him in stable condition.  At  the end the case all needle count sponge counts were correct.

## 2019-09-07 NOTE — H&P (Signed)
Sharon Horne is an 68 y.o. female.   Chief Complaint: Back bilateral hip and leg pain HPI: [name] with longstanding back and bilateral hip and leg pain rating down L4-L5 and S1 nerve root pattern.  Work-up revealed grade 1 spondylolisthesis L4-5 L5-S1 with severe spinal stenosis and marked facet arthropathy.  Due to her failed conservative treatment imaging findings and progression of clinical syndrome I recommended decompression stabilization procedure at L4-5 and L5-S1.  I extensively over the risks and benefits of the operation with her as well as perioperative course expectations of outcome and alternatives of surgery and she understood and agreed to proceed forward.  Past Medical History:  Diagnosis Date  . Sleep apnea     Past Surgical History:  Procedure Laterality Date  . ABDOMINAL HYSTERECTOMY    . APPENDECTOMY      History reviewed. No pertinent family history. Social History:  reports that she has never smoked. She has never used smokeless tobacco. She reports that she does not drink alcohol or use drugs.  Allergies: No Known Allergies  Medications Prior to Admission  Medication Sig Dispense Refill  . atorvastatin (LIPITOR) 10 MG tablet Take 10 mg by mouth daily.    . Biotin 2500 MCG CAPS Take 2,500 mcg by mouth daily.    Marland Kitchen gabapentin (NEURONTIN) 300 MG capsule Take 300 mg by mouth every 8 (eight) hours as needed for pain.    Marland Kitchen omega-3 acid ethyl esters (LOVAZA) 1 G capsule Take 1 g by mouth daily.    Vladimir Faster Glycol-Propyl Glycol (SYSTANE OP) Place 1 drop into both eyes daily.    . Probiotic Product (ALIGN) 4 MG CAPS Take 4 mg by mouth daily.    Marland Kitchen lidocaine-hydrocortisone (ANAMANTEL HC) 3-0.5 % CREA Apply to anal region as needed for pain. (Patient not taking: Reported on 08/28/2019) 85 g 0    No results found for this or any previous visit (from the past 48 hour(s)). No results found.  Review of Systems  Musculoskeletal: Positive for back pain.   Neurological: Positive for numbness.    Blood pressure (!) 161/79, pulse 75, temperature 98.5 F (36.9 C), temperature source Oral, resp. rate 17, height 5\' 2"  (1.575 m), weight 84.4 kg. Physical Exam  Constitutional: She is oriented to person, place, and time. She appears well-developed.  HENT:  Head: Normocephalic.  Eyes: Pupils are equal, round, and reactive to light.  Respiratory: Effort normal.  GI: Soft.  Musculoskeletal:     Cervical back: Normal range of motion.  Neurological: She is alert and oriented to person, place, and time. She has normal strength. GCS eye subscore is 4. GCS verbal subscore is 5. GCS motor subscore is 6.  Strength 5-5 iliopsoas, quads, hamstrings, gastroc, tibialis, and EHL.  Skin: Skin is warm and dry.     Assessment/Plan 68 year old female presents for decompression stabilization procedure L4-5  Montrice Montuori P, MD 09/07/2019, 11:36 AM

## 2019-09-07 NOTE — Anesthesia Postprocedure Evaluation (Signed)
Anesthesia Post Note  Patient: Sharon Horne  Procedure(s) Performed: Posterior Lumbar Interbody Fusion - Lumbar four-Lumbar five- Lumbar five-Sacral one (N/A Back)     Patient location during evaluation: PACU Anesthesia Type: General Level of consciousness: awake and alert Pain management: pain level controlled Vital Signs Assessment: post-procedure vital signs reviewed and stable Respiratory status: spontaneous breathing, nonlabored ventilation, respiratory function stable and patient connected to nasal cannula oxygen Cardiovascular status: blood pressure returned to baseline and stable Postop Assessment: no apparent nausea or vomiting Anesthetic complications: no    Last Vitals:  Vitals:   09/07/19 1742 09/07/19 1757  BP: (!) 145/71 (!) 155/84  Pulse: 62 89  Resp: 16 15  Temp:    SpO2: 100% 100%    Last Pain:  Vitals:   09/07/19 1757  TempSrc:   PainSc: Asleep                 Nickie Warwick,W. EDMOND

## 2019-09-07 NOTE — Progress Notes (Signed)
PHARMACIST - PHYSICIAN ORDER COMMUNICATION  CONCERNING: P&T Medication Policy on Herbal Medications  DESCRIPTION:  This patient's order for: Biotin  has been noted.  This product(s) is classified as an "herbal" or natural product. Due to a lack of definitive safety studies or FDA approval, nonstandard manufacturing practices, plus the potential risk of unknown drug-drug interactions while on inpatient medications, the Pharmacy and Therapeutics Committee does not permit the use of "herbal" or natural products of this type within [facility].   ACTION TAKEN: The pharmacy department is unable to verify this order at this time and your patient has been informed of this safety policy. Please reevaluate patient's clinical condition at discharge and address if the herbal or natural product(s) should be resumed at that time.  Gillermina Hu, PharmD, BCPS, [facility] Clinical Pharmacist 09/07/19, 18:52 PM

## 2019-09-08 MED ORDER — HYDROCODONE-ACETAMINOPHEN 5-325 MG PO TABS
1.0000 | ORAL_TABLET | ORAL | Status: DC | PRN
  Administered 2019-09-08 – 2019-09-09 (×6): 2 via ORAL
  Filled 2019-09-08 (×6): qty 2

## 2019-09-08 MED FILL — Thrombin For Soln 20000 Unit: CUTANEOUS | Qty: 1 | Status: AC

## 2019-09-08 MED FILL — Gelatin Absorbable MT Powder: OROMUCOSAL | Qty: 1 | Status: AC

## 2019-09-08 NOTE — Progress Notes (Signed)
Patient refused CPAP for tonight. RT informed patient that CPAP is available if she changes her mind. RT will monitor as needed.

## 2019-09-08 NOTE — Evaluation (Signed)
Physical Therapy Evaluation Patient Details Name: Sharon Horne MRN: [id number] DOB: Sep 09, 1951 Today's Date: 09/08/2019   History of Present Illness  Pt is a 68 y/o female with long standing back, B hip, and leg pain failing conservative measures. Now s/p PLIF L4-5 L5-S1. PMH: sleep apnea.   Clinical Impression  Pt admitted with above diagnosis. At the time of PT eval, pt was able to demonstrate transfers and ambulation with supervision for safety to modified independent without an AD. Pt was educated on precautions, brace application/wearing schedule, appropriate activity progression, and car transfer. Pt currently with functional limitations due to the deficits listed below (see PT Problem List). Pt will benefit from skilled PT to increase their independence and safety with mobility to allow discharge to the venue listed below.      Follow Up Recommendations No PT follow up;Supervision - Intermittent    Equipment Recommendations  None recommended by PT    Recommendations for Other Services       Precautions / Restrictions Precautions Precautions: Back Precaution Booklet Issued: Yes (comment) Precaution Comments: reviewed with pt Required Braces or Orthoses: Spinal Brace Spinal Brace: Lumbar corset;Applied in sitting position Spinal Brace Comments: on upon entry Restrictions Weight Bearing Restrictions: No      Mobility  Bed Mobility Overal bed mobility: Modified Independent             General bed mobility comments: Pt was able to transition to EOB without assistance. Good log roll technique.   Transfers Overall transfer level: Needs assistance Equipment used: None Transfers: Sit to/from Stand Sit to Stand: Supervision         General transfer comment: Light supervision for safety progressing to mod I. No assist required and no unsteadiness noted.   Ambulation/Gait Ambulation/Gait assistance: Supervision Gait Distance (Feet): 350 Feet Assistive device:  None Gait Pattern/deviations: Step-through pattern;Decreased stride length;Trunk flexed Gait velocity: Decreased Gait velocity interpretation: <1.8 ft/sec, indicate of risk for recurrent falls General Gait Details: Pt ambulating slow but generally steady in hallway without an AD. Pt reports 3/10 pain while ambulating.   Stairs            Wheelchair Mobility    Modified Rankin (Stroke Patients Only)       Balance Overall balance assessment: Mild deficits observed, not formally tested                                           Pertinent Vitals/Pain Pain Assessment: 0-10 Pain Score: 3  Faces Pain Scale: Hurts little more Pain Location: back, incisional Pain Descriptors / Indicators: Discomfort;Operative site guarding Pain Intervention(s): Limited activity within patient's tolerance;Monitored during session;Repositioned    Home Living Family/patient expects to be discharged to:: Private residence Living Arrangements: Spouse/significant other Available Help at Discharge: Family Type of Home: House Home Access: Level entry     Home Layout: One level Home Equipment: None      Prior Function Level of Independence: Independent         Comments: Retired; independent with ADLs, mobility, IADLs      Hand Dominance        Extremity/Trunk Assessment   Upper Extremity Assessment Upper Extremity Assessment: Overall WFL for tasks assessed    Lower Extremity Assessment Lower Extremity Assessment: Generalized weakness(Mild; Consistent with pre-op diagnosis)    Cervical / Trunk Assessment Cervical / Trunk Assessment: Other exceptions Cervical / Trunk Exceptions: s/p  back surgery  Communication   Communication: No difficulties  Cognition Arousal/Alertness: Awake/alert Behavior During Therapy: WFL for tasks assessed/performed Overall Cognitive Status: Within Functional Limits for tasks assessed                                         General Comments      Exercises     Assessment/Plan    PT Assessment Patient needs continued PT services  PT Problem List Decreased strength;Decreased activity tolerance;Decreased balance;Decreased mobility;Decreased knowledge of use of DME;Decreased knowledge of precautions;Decreased safety awareness;Pain       PT Treatment Interventions DME instruction;Gait training;Functional mobility training;Therapeutic activities;Therapeutic exercise;Neuromuscular re-education;Patient/family education    PT Goals (Current goals can be found in the Care Plan section)  Acute Rehab PT Goals Patient Stated Goal: home tomorrow PT Goal Formulation: With patient Time For Goal Achievement: 09/15/19 Potential to Achieve Goals: Good    Frequency Min 5X/week   Barriers to discharge Decreased caregiver support      Co-evaluation               AM-PAC PT "6 Clicks" Mobility  Outcome Measure Help needed turning from your back to your side while in a flat bed without using bedrails?: None Help needed moving from lying on your back to sitting on the side of a flat bed without using bedrails?: None Help needed moving to and from a bed to a chair (including a wheelchair)?: None Help needed standing up from a chair using your arms (e.g., wheelchair or bedside chair)?: None Help needed to walk in hospital room?: None   6 Click Score: 20    End of Session Equipment Utilized During Treatment: Back brace Activity Tolerance: Patient tolerated treatment well Patient left: in chair;with call bell/phone within reach Nurse Communication: Mobility status PT Visit Diagnosis: Unsteadiness on feet (R26.81);Pain Pain - part of body: (back)    Time: [id number] PT Time Calculation (min) (ACUTE ONLY): 19 min   Charges:   PT Evaluation $PT Eval Moderate Complexity: 1 Mod          [name], PT, DPT Acute Rehabilitation Services Pager: [phone] Office: [phone]   [name] 09/08/2019, 1:18 PM

## 2019-09-08 NOTE — Evaluation (Signed)
Occupational Therapy Evaluation Patient Details Name: Sharon Horne MRN: [id number] DOB: October 28, 1950 Today's Date: 09/08/2019    History of Present Illness Pt is a 68 y/o female with long standing back, B hip, and leg pain failing conservative measures. Now s/p PLIF L4-5 L5-S1. PMH: sleep apnea.    Clinical Impression   PTA patient independent with ADLs, mobility. Admitted for above and limited by problem list below, including impaired and precautions.  Patient educated on back precautions, ADL compensatory techniques, DME/AE recommendations, brace mgmt and wear schedule, and safety.  Patient currently requires supervision for transfers, min assist for LB ADLs after education and use of AE (sock aide, reacher), supervision for grooming at sink. Min cueing for back precautions during session. Patient educated on use of 3:1 vs shower stool for bathing in shower, but pt reports will talk to her spouse about which would fit better. Patient will benefit from continued OT services while admitted in order to optimize independence and safety with ADLs while adhering to back precautions.  Anticipate no further needs after dc home.     Follow Up Recommendations  No OT follow up;Supervision - Intermittent    Equipment Recommendations  3 in 1 bedside commode;Tub/shower seat(pt deciding for shower chair )    Recommendations for Other Services       Precautions / Restrictions Precautions Precautions: Back Precaution Booklet Issued: Yes (comment) Precaution Comments: reviewed with pt Required Braces or Orthoses: Spinal Brace Spinal Brace: Lumbar corset;Other (comment) Spinal Brace Comments: on upon entry Restrictions Weight Bearing Restrictions: No      Mobility Bed Mobility               General bed mobility comments: in recliner upon entry  Transfers Overall transfer level: Needs assistance   Transfers: Sit to/from Stand Sit to Stand: Supervision         General transfer  comment: good safety and posture    Balance Overall balance assessment: Mild deficits observed, not formally tested                                         ADL either performed or assessed with clinical judgement   ADL Overall ADL's : Needs assistance/impaired     Grooming: Supervision/safety;Standing   Upper Body Bathing: Supervision/ safety;Sitting   Lower Body Bathing: Minimal assistance;Sit to/from stand Lower Body Bathing Details (indicate cue type and reason): educated on use of long sponge, unable to reach feet; agreeable to compelte seated Upper Body Dressing : Supervision/safety;Sitting   Lower Body Dressing: Minimal assistance;Sit to/from stand Lower Body Dressing Details (indicate cue type and reason): requires assist for B socks; educated on and return demosntrated use of AE for LB dressing (sock aide, reacher, shoe horn) with min assist Toilet Transfer: Supervision/safety;Ambulation   Toileting- Clothing Manipulation and Hygiene: Supervision/safety;Sit to/from stand       Functional mobility during ADLs: Supervision/safety General ADL Comments: pt educated on back precautions, brace mgmt /wear schedule, ADL compensatory techniques and recommendations      Vision   Vision Assessment?: No apparent visual deficits     Perception     Praxis      Pertinent Vitals/Pain Pain Assessment: Faces Faces Pain Scale: Hurts little more Pain Location: back, incisional Pain Descriptors / Indicators: Discomfort;Operative site guarding Pain Intervention(s): Monitored during session;Repositioned     Hand Dominance     Extremity/Trunk Assessment Upper Extremity Assessment  Upper Extremity Assessment: Overall WFL for tasks assessed   Lower Extremity Assessment Lower Extremity Assessment: Defer to PT evaluation   Cervical / Trunk Assessment Cervical / Trunk Assessment: Other exceptions Cervical / Trunk Exceptions: s/p back surgery   Communication  Communication Communication: No difficulties   Cognition Arousal/Alertness: Awake/alert Behavior During Therapy: WFL for tasks assessed/performed Overall Cognitive Status: Within Functional Limits for tasks assessed                                     General Comments       Exercises     Shoulder Instructions      Home Living Family/patient expects to be discharged to:: Private residence Living Arrangements: Spouse/significant other Available Help at Discharge: Family Type of Home: House       Home Layout: One level     Bathroom Shower/Tub: Occupational psychologist: Standard                Prior Functioning/Environment Level of Independence: Independent        Comments: independent with ADLs, mobility, IADLs         OT Problem List: Decreased activity tolerance;Pain;Obesity;Decreased knowledge of precautions;Decreased knowledge of use of DME or AE      OT Treatment/Interventions: Self-care/ADL training;DME and/or AE instruction;Therapeutic activities;Patient/family education;Balance training    OT Goals(Current goals can be found in the care plan section) Acute Rehab OT Goals Patient Stated Goal: home tomorrow OT Goal Formulation: With patient Time For Goal Achievement: 09/22/19 Potential to Achieve Goals: Good  OT Frequency: Min 2X/week   Barriers to D/C:            Co-evaluation              AM-PAC OT "6 Clicks" Daily Activity     Outcome Measure Help from another person eating meals?: None Help from another person taking care of personal grooming?: A Little Help from another person toileting, which includes using toliet, bedpan, or urinal?: A Little Help from another person bathing (including washing, rinsing, drying)?: A Little Help from another person to put on and taking off regular upper body clothing?: None Help from another person to put on and taking off regular lower body clothing?: A Little 6 Click  Score: 20   End of Session Equipment Utilized During Treatment: Back brace Nurse Communication: Mobility status  Activity Tolerance: Patient tolerated treatment well Patient left: in chair;with call bell/phone within reach  OT Visit Diagnosis: Other abnormalities of gait and mobility (R26.89);Pain Pain - part of body: (back)                Time: [id number] OT Time Calculation (min): 21 min Charges:  OT General Charges $OT Visit: 1 Visit OT Evaluation $OT Eval Low Complexity: 1 Low  Jolaine Artist, OT Acute Rehabilitation Services Pager [phone] Office [phone]   Delight Stare 09/08/2019, 12:20 PM

## 2019-09-09 NOTE — Discharge Instructions (Signed)

## 2019-09-09 NOTE — Plan of Care (Signed)
Patient alert and oriented, mae's well, voiding adequate amount of urine, swallowing without difficulty, c/o soreness at time of discharge and medication given prior to dischargedPatient discharged home with family. Script and discharged instructions given to patient. Patient and family stated understanding of instructions given. Patient has an appointment with Dr. Saintclair Halsted

## 2019-09-09 NOTE — Progress Notes (Signed)
Physical Therapy Treatment Patient Details Name: Sharon Horne MRN: [id number] DOB: September 03, 1951 Today's Date: 09/09/2019    History of Present Illness Pt is a 68 y/o female with long standing back, B hip, and leg pain failing conservative measures. Now s/p PLIF L4-5 L5-S1. PMH: sleep apnea.     PT Comments    Pt progressing well with post-op mobility. She was able to demonstrate transfers and ambulation with gross modified independence and no AD. Pt was educated on precautions, brace application/wearing schedule, appropriate activity progression, and car transfer. Pt anticipates d/c home today. Will continue to follow.     Follow Up Recommendations  No PT follow up;Supervision - Intermittent     Equipment Recommendations  None recommended by PT    Recommendations for Other Services       Precautions / Restrictions Precautions Precautions: Back Precaution Booklet Issued: Yes (comment) Precaution Comments: pt able to state 3/3 back precautions Required Braces or Orthoses: Spinal Brace Spinal Brace: Lumbar corset;Applied in sitting position Spinal Brace Comments: on upon entry Restrictions Weight Bearing Restrictions: No    Mobility  Bed Mobility               General bed mobility comments: Sitting up EOB when PT arrived.   Transfers Overall transfer level: Modified independent Equipment used: None Transfers: Sit to/from Stand          General transfer comment: No assist required. No unsteadiness noted.   Ambulation/Gait Ambulation/Gait assistance: Modified independent (Device/Increase time) Gait Distance (Feet): 350 Feet Assistive device: None Gait Pattern/deviations: Step-through pattern;Decreased stride length;Trunk flexed Gait velocity: Decreased Gait velocity interpretation: <1.8 ft/sec, indicate of risk for recurrent falls General Gait Details: Pt ambulating slow but generally steady in hallway without an AD.    Stairs              Wheelchair Mobility    Modified Rankin (Stroke Patients Only)       Balance Overall balance assessment: Mild deficits observed, not formally tested                                          Cognition Arousal/Alertness: Awake/alert Behavior During Therapy: WFL for tasks assessed/performed Overall Cognitive Status: Within Functional Limits for tasks assessed                                        Exercises      General Comments        Pertinent Vitals/Pain Pain Assessment: Faces Faces Pain Scale: Hurts a little bit Pain Location: back, incisional Pain Descriptors / Indicators: Operative site guarding;Sore Pain Intervention(s): Monitored during session;Repositioned    Home Living                      Prior Function            PT Goals (current goals can now be found in the care plan section) Acute Rehab PT Goals Patient Stated Goal: home today PT Goal Formulation: With patient Time For Goal Achievement: 09/15/19 Potential to Achieve Goals: Good Progress towards PT goals: Progressing toward goals    Frequency    Min 5X/week      PT Plan Current plan remains appropriate    Co-evaluation  AM-PAC PT "6 Clicks" Mobility   Outcome Measure  Help needed turning from your back to your side while in a flat bed without using bedrails?: None Help needed moving from lying on your back to sitting on the side of a flat bed without using bedrails?: None Help needed moving to and from a bed to a chair (including a wheelchair)?: None Help needed standing up from a chair using your arms (e.g., wheelchair or bedside chair)?: None Help needed to walk in hospital room?: None Help needed climbing 3-5 steps with a railing? : A Little 6 Click Score: 23    End of Session Equipment Utilized During Treatment: Back brace Activity Tolerance: Patient tolerated treatment well Patient left: in chair;with call  bell/phone within reach Nurse Communication: Mobility status PT Visit Diagnosis: Unsteadiness on feet (R26.81);Pain Pain - part of body: (back)     Time: [id number] PT Time Calculation (min) (ACUTE ONLY): 12 min  Charges:  $Gait Training: 8-22 mins                     [name], PT, DPT Acute Rehabilitation Services Pager: [phone] Office: [phone]    [name] 09/09/2019, 12:01 PM

## 2019-09-09 NOTE — Discharge Summary (Signed)
Physician Discharge Summary  Patient ID: Sharon Horne MRN: [id number] DOB/AGE: Jul 25, 1951 68 y.o. Estimated body mass index is 34.02 kg/m as calculated from the following:   Height as of this encounter: 5\' 2"  (1.575 m).   Weight as of this encounter: 84.4 kg.   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  Postoperatively patient did very well recovering the floor on the floor was ambulating and voiding spontaneously tolerating regular diet and stable for discharge home.  Patient will be discharged scheduled follow-up in 1 to 2 weeks.  Consults: Significant Diagnostic Studies: Treatments: Posterior lumbar interbody fusions L4-5 L5-S1 Discharge Exam: Blood pressure 115/63, pulse 83, temperature 98.4 F (36.9 C), temperature source Oral, resp. rate 18, height 5\' 2"  (1.575 m), weight 84.4 kg, SpO2 97 %. Strength 5-5 wound clean dry and intact  Disposition: Home      Signed: Sharra Cayabyab P 09/09/2019, 11:46 AM

## 2019-09-09 NOTE — Progress Notes (Signed)
Occupational Therapy Treatment Patient Details Name: Sharon Horne MRN: [id number] DOB: 01/13/1951 Today's Date: 09/09/2019    History of present illness Pt is a 68 y/o female with long standing back, B hip, and leg pain failing conservative measures. Now s/p PLIF L4-5 L5-S1. PMH: sleep apnea.    OT comments  Pt making steady progress towards OT goals this session. Session focus on functional mobility, standing grooming tasks and continued education on AE for LB bathing/ dressing. Issued pt handout of all AE with pt stating she plans to get a sock aid for home. Overall, pt completes functional mobility and standing grooming with supervision with no AD. Education provided on using 2 cup method for oral care to assist with maintaining back precautions during ADL routine. Pt declined 3n1/ shower seat for home, updated DME recs to reflect change. Will let OTR know about change in POC. DC plan remains appropriate, will continue to follow acutely per POC.    Follow Up Recommendations  No OT follow up;Supervision - Intermittent    Equipment Recommendations  None recommended by OT    Recommendations for Other Services      Precautions / Restrictions Precautions Precautions: Back Precaution Booklet Issued: Yes (comment) Precaution Comments: pt able to state 3/3 back precautions Required Braces or Orthoses: Spinal Brace Spinal Brace: Lumbar corset;Applied in sitting position Spinal Brace Comments: on upon entry Restrictions Weight Bearing Restrictions: No       Mobility Bed Mobility               General bed mobility comments: OOB in recliner  Transfers Overall transfer level: Needs assistance Equipment used: None Transfers: Sit to/from Stand Sit to Stand: Supervision         General transfer comment: gross supervision for safety; good technique noted    Balance Overall balance assessment: Mild deficits observed, not formally tested                                          ADL either performed or assessed with clinical judgement   ADL Overall ADL's : Needs assistance/impaired     Grooming: Wash/dry hands;Wash/dry face;Oral care;Standing;Supervision/safety;Cueing for compensatory techniques Grooming Details (indicate cue type and reason): cues to use 2 cup method as pt noted to attempt to bend forward to spit into sink       Lower Body Bathing Details (indicate cue type and reason): educated on use of long sponge, unable to reach feet; agreeable to compelte seated   Upper Body Dressing Details (indicate cue type and reason): continued education on donning brace, however pt with brace already donned at start of session   Lower Body Dressing Details (indicate cue type and reason): issued pt handout with AE for LB dressing, pt declined practicing but states she plans to purchase sock aid to asssit with socks. pt states she wear slip on sneakers most days that she doesn't need to reach down and tie Toilet Transfer: Supervision/safety;Ambulation Toilet Transfer Details (indicate cue type and reason): simulated via functional mobility   Toileting - Clothing Manipulation Details (indicate cue type and reason): education on compensatory strategies for hygiene with pt verbalizing understanding   Tub/Shower Transfer Details (indicate cue type and reason): pt states she has grab bars in her shower and plans to use those for safety rather than using a shower seat Functional mobility during ADLs: Supervision/safety General ADL Comments: session focus  on functional mobility, standing grooming and continued education AE for LB bathing/ dressing     Vision   Vision Assessment?: No apparent visual deficits   Perception     Praxis      Cognition Arousal/Alertness: Awake/alert Behavior During Therapy: WFL for tasks assessed/performed Overall Cognitive Status: Within Functional Limits for tasks assessed                                           Exercises     Shoulder Instructions       General Comments      Pertinent Vitals/ Pain       Pain Assessment: Faces Faces Pain Scale: Hurts a little bit Pain Location: back, incisional Pain Descriptors / Indicators: Operative site guarding;Sore Pain Intervention(s): Monitored during session;Repositioned  Home Living                                          Prior Functioning/Environment              Frequency  Min 2X/week        Progress Toward Goals  OT Goals(current goals can now be found in the care plan section)  Progress towards OT goals: Progressing toward goals  Acute Rehab OT Goals Patient Stated Goal: home tomorrow OT Goal Formulation: With patient Time For Goal Achievement: 09/22/19 Potential to Achieve Goals: Good  Plan Discharge plan remains appropriate    Co-evaluation                 AM-PAC OT "6 Clicks" Daily Activity     Outcome Measure   Help from another person eating meals?: None Help from another person taking care of personal grooming?: A Little Help from another person toileting, which includes using toliet, bedpan, or urinal?: A Little Help from another person bathing (including washing, rinsing, drying)?: A Little Help from another person to put on and taking off regular upper body clothing?: None Help from another person to put on and taking off regular lower body clothing?: A Little 6 Click Score: 20    End of Session Equipment Utilized During Treatment: Back brace  OT Visit Diagnosis: Other abnormalities of gait and mobility (R26.89);Pain   Activity Tolerance Patient tolerated treatment well   Patient Left in chair;with call bell/phone within reach   Nurse Communication Mobility status        Time: [id number] OT Time Calculation (min): 14 min  Charges: OT General Charges $OT Visit: 1 Visit OT Treatments $Self Care/Home Management : 8-22 mins  Sharon Horne., COTA/L Acute  Rehabilitation Services [phone]    Sharon Horne 09/09/2019, 9:09 AM

## 2019-09-10 MED FILL — Heparin Sodium (Porcine) Inj 1000 Unit/ML: INTRAMUSCULAR | Qty: 30 | Status: AC

## 2019-09-10 MED FILL — Sodium Chloride IV Soln 0.9%: INTRAVENOUS | Qty: 2000 | Status: AC

## 2019-10-18 ENCOUNTER — Ambulatory Visit: Payer: Medicare Other

## 2019-11-03 DIAGNOSIS — M4316 Spondylolisthesis, lumbar region: Secondary | ICD-10-CM | POA: Diagnosis not present

## 2019-11-05 DIAGNOSIS — M4316 Spondylolisthesis, lumbar region: Secondary | ICD-10-CM | POA: Diagnosis not present

## 2019-11-09 DIAGNOSIS — M4316 Spondylolisthesis, lumbar region: Secondary | ICD-10-CM | POA: Diagnosis not present

## 2019-11-10 DIAGNOSIS — M4316 Spondylolisthesis, lumbar region: Secondary | ICD-10-CM | POA: Diagnosis not present

## 2019-11-16 DIAGNOSIS — M4316 Spondylolisthesis, lumbar region: Secondary | ICD-10-CM | POA: Diagnosis not present

## 2019-11-18 DIAGNOSIS — M4316 Spondylolisthesis, lumbar region: Secondary | ICD-10-CM | POA: Diagnosis not present

## 2020-02-09 DIAGNOSIS — M4316 Spondylolisthesis, lumbar region: Secondary | ICD-10-CM | POA: Diagnosis not present

## 2020-02-16 DIAGNOSIS — H26491 Other secondary cataract, right eye: Secondary | ICD-10-CM | POA: Diagnosis not present

## 2020-02-16 DIAGNOSIS — H04123 Dry eye syndrome of bilateral lacrimal glands: Secondary | ICD-10-CM | POA: Diagnosis not present

## 2020-02-16 DIAGNOSIS — H35413 Lattice degeneration of retina, bilateral: Secondary | ICD-10-CM | POA: Diagnosis not present

## 2020-02-16 DIAGNOSIS — Z961 Presence of intraocular lens: Secondary | ICD-10-CM | POA: Diagnosis not present

## 2020-05-10 DIAGNOSIS — M4316 Spondylolisthesis, lumbar region: Secondary | ICD-10-CM | POA: Diagnosis not present

## 2020-05-10 DIAGNOSIS — M25561 Pain in right knee: Secondary | ICD-10-CM | POA: Diagnosis not present

## 2020-05-24 ENCOUNTER — Ambulatory Visit: Payer: Medicare Other | Admitting: Orthopaedic Surgery

## 2020-06-23 DIAGNOSIS — Z23 Encounter for immunization: Secondary | ICD-10-CM | POA: Diagnosis not present

## 2020-06-27 IMAGING — MR MR LUMBAR SPINE W/O CM
5 series · 48 of 48 positions shown · non-contrast
Comparison: None.

CLINICAL DATA: Low back pain radiating to the left buttock and
ankle over the last month.

EXAM:
MRI LUMBAR SPINE WITHOUT CONTRAST
TECHNIQUE: Multiplanar, multisequence MR imaging of the lumbar spine was
performed. No intravenous contrast was administered.

[Series 3: T2 · sagittal · 4.0mm · 0.81mm/px · 6 of 13 slices shown (1 of 2)]
[im 1/13]
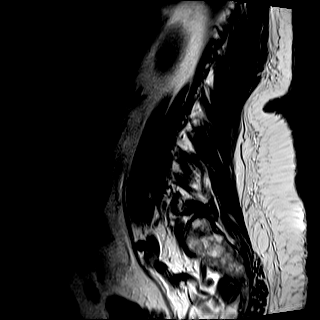
[im 3/13]
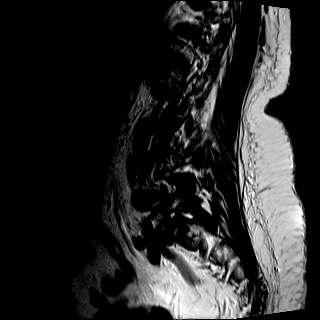
[im 5/13]
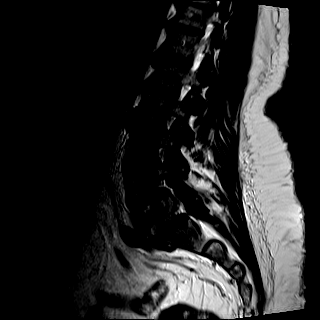
[im 8/13]
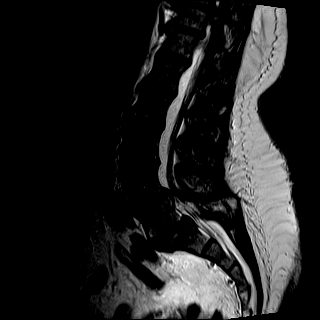
[im 10/13]
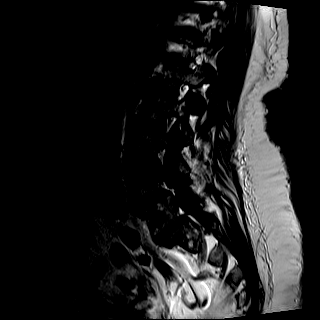
[im 13/13]
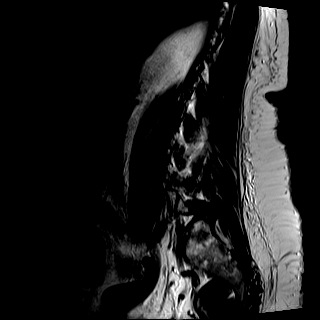

[Series 4: T1 · sagittal · 4.0mm · 1.02mm/px · 6 of 13 slices shown (1 of 2)]
[im 1/13]
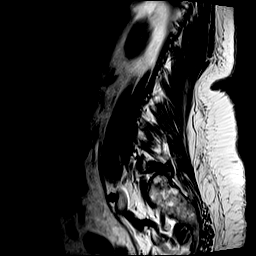
[im 3/13]
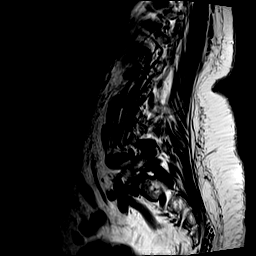
[im 5/13]
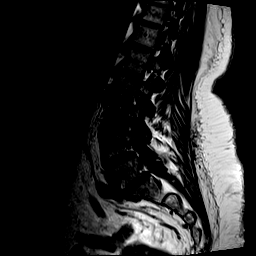
[im 8/13]
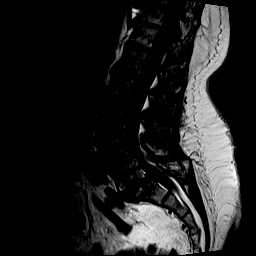
[im 10/13]
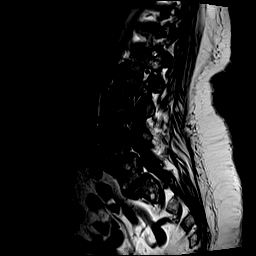
[im 13/13]
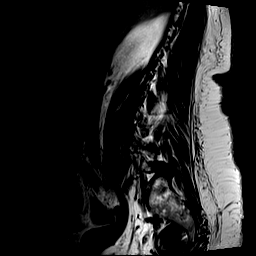

[Series 5: STIR · sagittal · 4.0mm · 1.02mm/px · 6 of 13 slices shown]
[im 1/13]
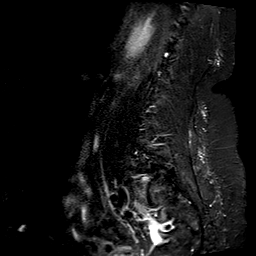
[im 3/13]
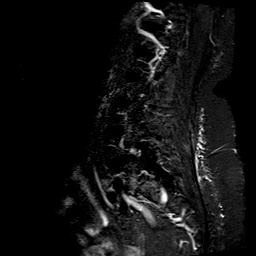
[im 5/13]
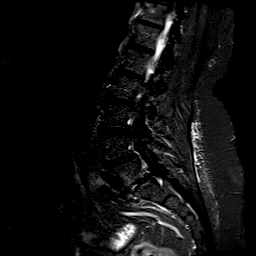
[im 8/13]
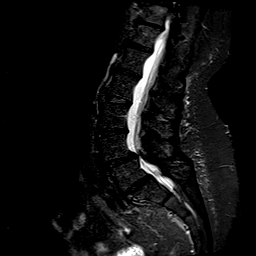
[im 10/13]
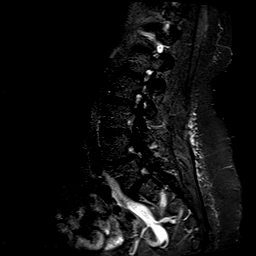
[im 13/13]
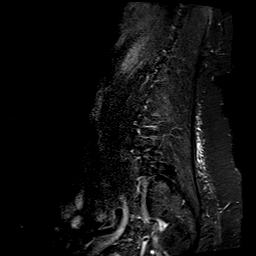

[Series 6: T2 · axial · 4.0mm · 0.78mm/px · z∈[-78,+130]mm · 15 of 34 slices shown (2 of 2)]
[im 1/34]
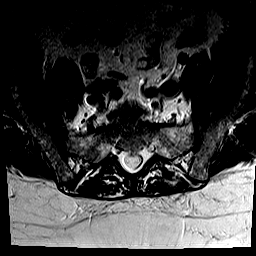
[im 3/34]
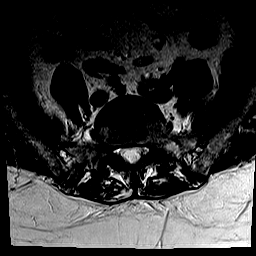
[im 5/34]
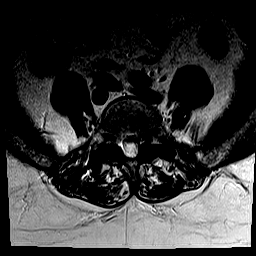
[im 8/34]
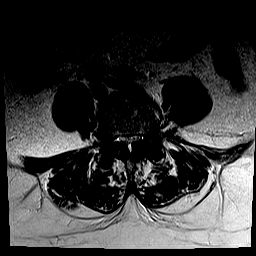
[im 10/34]
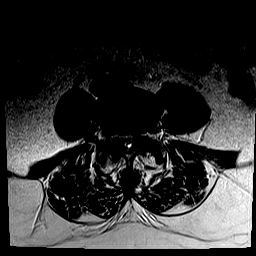
[im 12/34]
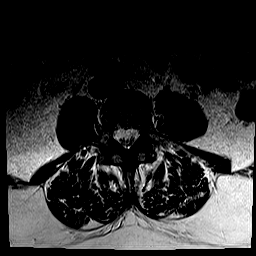
[im 15/34]
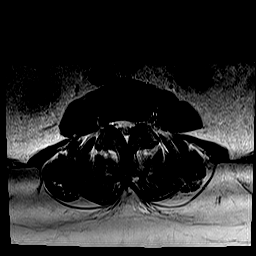
[im 17/34]
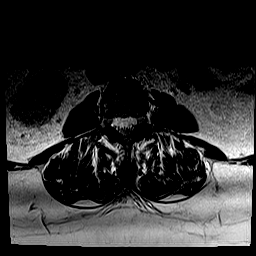
[im 19/34]
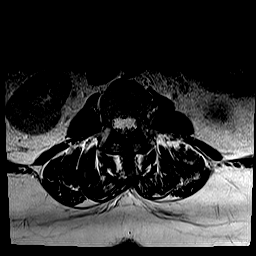
[im 22/34]
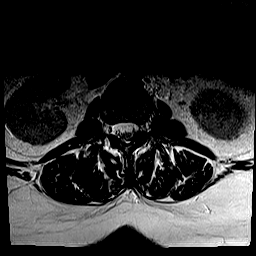
[im 24/34]
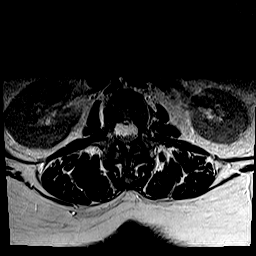
[im 26/34]
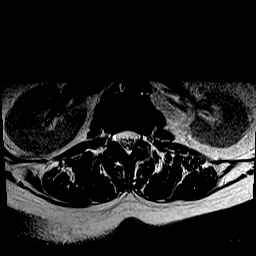
[im 29/34]
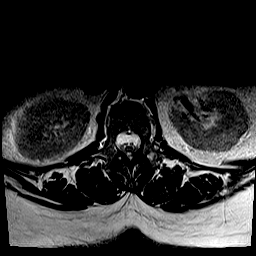
[im 31/34]
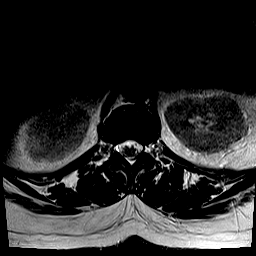
[im 34/34]
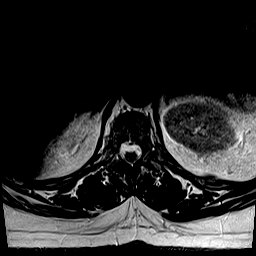

[Series 7: T1 · axial · 4.0mm · 0.78mm/px · z∈[-78,+130]mm · 15 of 34 slices shown (2 of 2)]
[im 1/34]
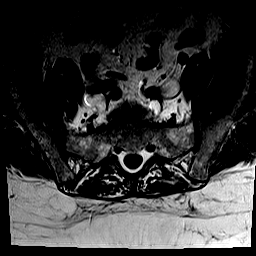
[im 3/34]
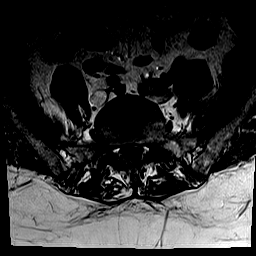
[im 5/34]
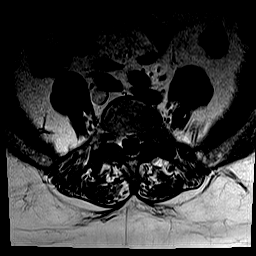
[im 8/34]
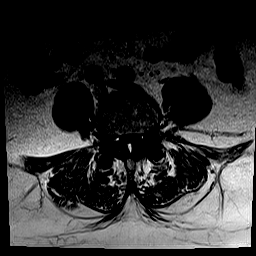
[im 10/34]
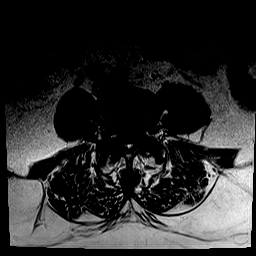
[im 12/34]
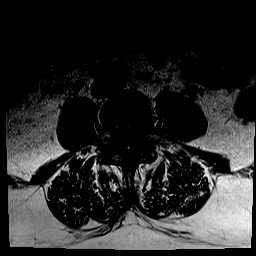
[im 15/34]
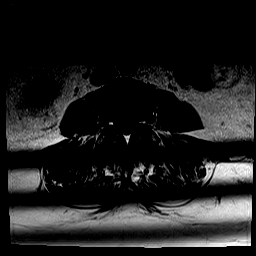
[im 17/34]
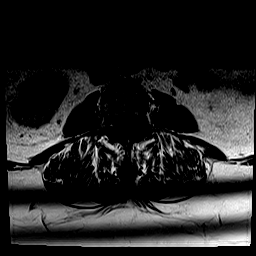
[im 19/34]
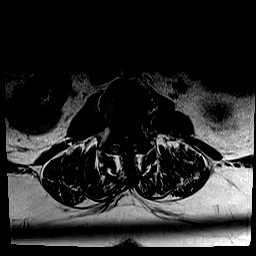
[im 22/34]
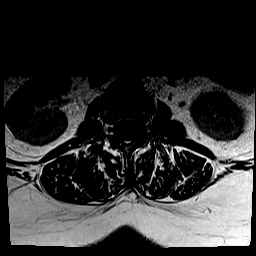
[im 24/34]
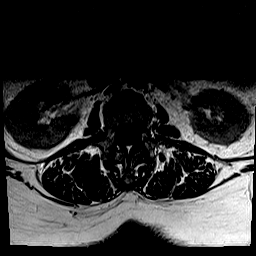
[im 26/34]
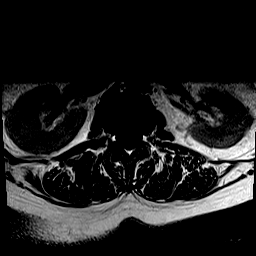
[im 29/34]
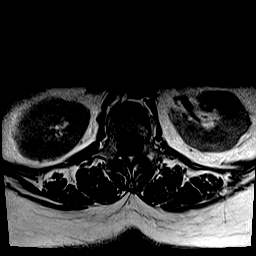
[im 31/34]
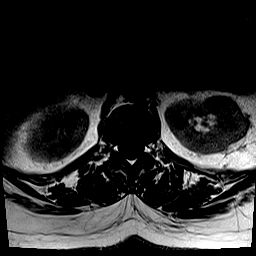
[im 34/34]
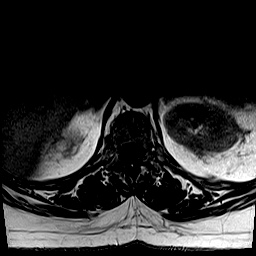

[48 of 48 positions shown; findings below may reference images not displayed]

FINDINGS: Segmentation:  5 lumbar type vertebral bodies.

Alignment:  6-7 mm of anterolisthesis at both L4-5 and L5-S1.

Vertebrae:  No fracture or primary bone lesion.

Conus medullaris and cauda equina: Conus extends to the L1-2 level.
Conus and cauda equina appear normal.

Paraspinal and other soft tissues: Negative

Disc levels:

Non-compressive disc bulges at T10-11 and T11-12.

T12-L1 through L2-3 levels are normal for age.

L3-4: Disc desiccation with mild bulging of the disc. No compressive
stenosis.

L4-5: Advanced bilateral facet arthropathy with anterolisthesis of
6-7 mm. Bulging of the disc. Severe spinal stenosis that could cause
neural compression on either or both sides.

L5-S1: Advanced bilateral facet arthropathy with anterolisthesis of
6-7 mm. Bulging of the disc. No central canal stenosis. Narrowing of
the subarticular lateral recesses and neural foramina that could
possibly cause neural compression.
IMPRESSION: L4-5: Advanced facet arthropathy with 6-7 mm of anterolisthesis and
bulging of the disc. Severe multifactorial spinal stenosis that
could cause neural compression on either or both sides.

L5-S1: Advanced facet arthropathy with 6-7 mm of anterolisthesis.
Sufficient patency of the central canal. Mild stenosis of the
subarticular lateral recesses and neural foramina with some
potential for neural compression, particularly of the exiting L5
nerves.

## 2020-07-05 DIAGNOSIS — Z23 Encounter for immunization: Secondary | ICD-10-CM | POA: Diagnosis not present

## 2020-07-08 DIAGNOSIS — Z0000 Encounter for general adult medical examination without abnormal findings: Secondary | ICD-10-CM | POA: Diagnosis not present

## 2020-07-08 DIAGNOSIS — Z1389 Encounter for screening for other disorder: Secondary | ICD-10-CM | POA: Diagnosis not present

## 2020-07-08 DIAGNOSIS — J302 Other seasonal allergic rhinitis: Secondary | ICD-10-CM | POA: Diagnosis not present

## 2020-07-08 DIAGNOSIS — E7800 Pure hypercholesterolemia, unspecified: Secondary | ICD-10-CM | POA: Diagnosis not present

## 2020-07-08 DIAGNOSIS — M47816 Spondylosis without myelopathy or radiculopathy, lumbar region: Secondary | ICD-10-CM | POA: Diagnosis not present

## 2020-07-08 DIAGNOSIS — Z6836 Body mass index (BMI) 36.0-36.9, adult: Secondary | ICD-10-CM | POA: Diagnosis not present

## 2020-07-08 DIAGNOSIS — G4733 Obstructive sleep apnea (adult) (pediatric): Secondary | ICD-10-CM | POA: Diagnosis not present

## 2021-02-15 DIAGNOSIS — L304 Erythema intertrigo: Secondary | ICD-10-CM | POA: Diagnosis not present

## 2021-02-16 DIAGNOSIS — H35413 Lattice degeneration of retina, bilateral: Secondary | ICD-10-CM | POA: Diagnosis not present

## 2021-02-16 DIAGNOSIS — H26491 Other secondary cataract, right eye: Secondary | ICD-10-CM | POA: Diagnosis not present

## 2021-02-16 DIAGNOSIS — Z961 Presence of intraocular lens: Secondary | ICD-10-CM | POA: Diagnosis not present

## 2021-02-16 DIAGNOSIS — H04123 Dry eye syndrome of bilateral lacrimal glands: Secondary | ICD-10-CM | POA: Diagnosis not present

## 2021-03-07 DIAGNOSIS — Z20822 Contact with and (suspected) exposure to covid-19: Secondary | ICD-10-CM | POA: Diagnosis not present

## 2021-04-25 IMAGING — RF DG C-ARM 1-60 MIN
1 series · 3 of 3 positions shown · non-contrast
Comparison: Lumbar MRI 11/09/2018.

CLINICAL DATA: 68-year-old female undergoing lumbar surgery.

EXAM:
DG C-ARM 1-60 MIN; LUMBAR SPINE - 2-3 VIEW
FLUOROSCOPY TIME:  Fluoroscopy Time:  1 minutes 15 seconds
Radiation Exposure Index (if provided by the fluoroscopic device):
Number of Acquired Spot Images: 0

[Series 1: unknown protocol · 0.14mm/px · 3 of 3 slices shown]
[im 1/3]
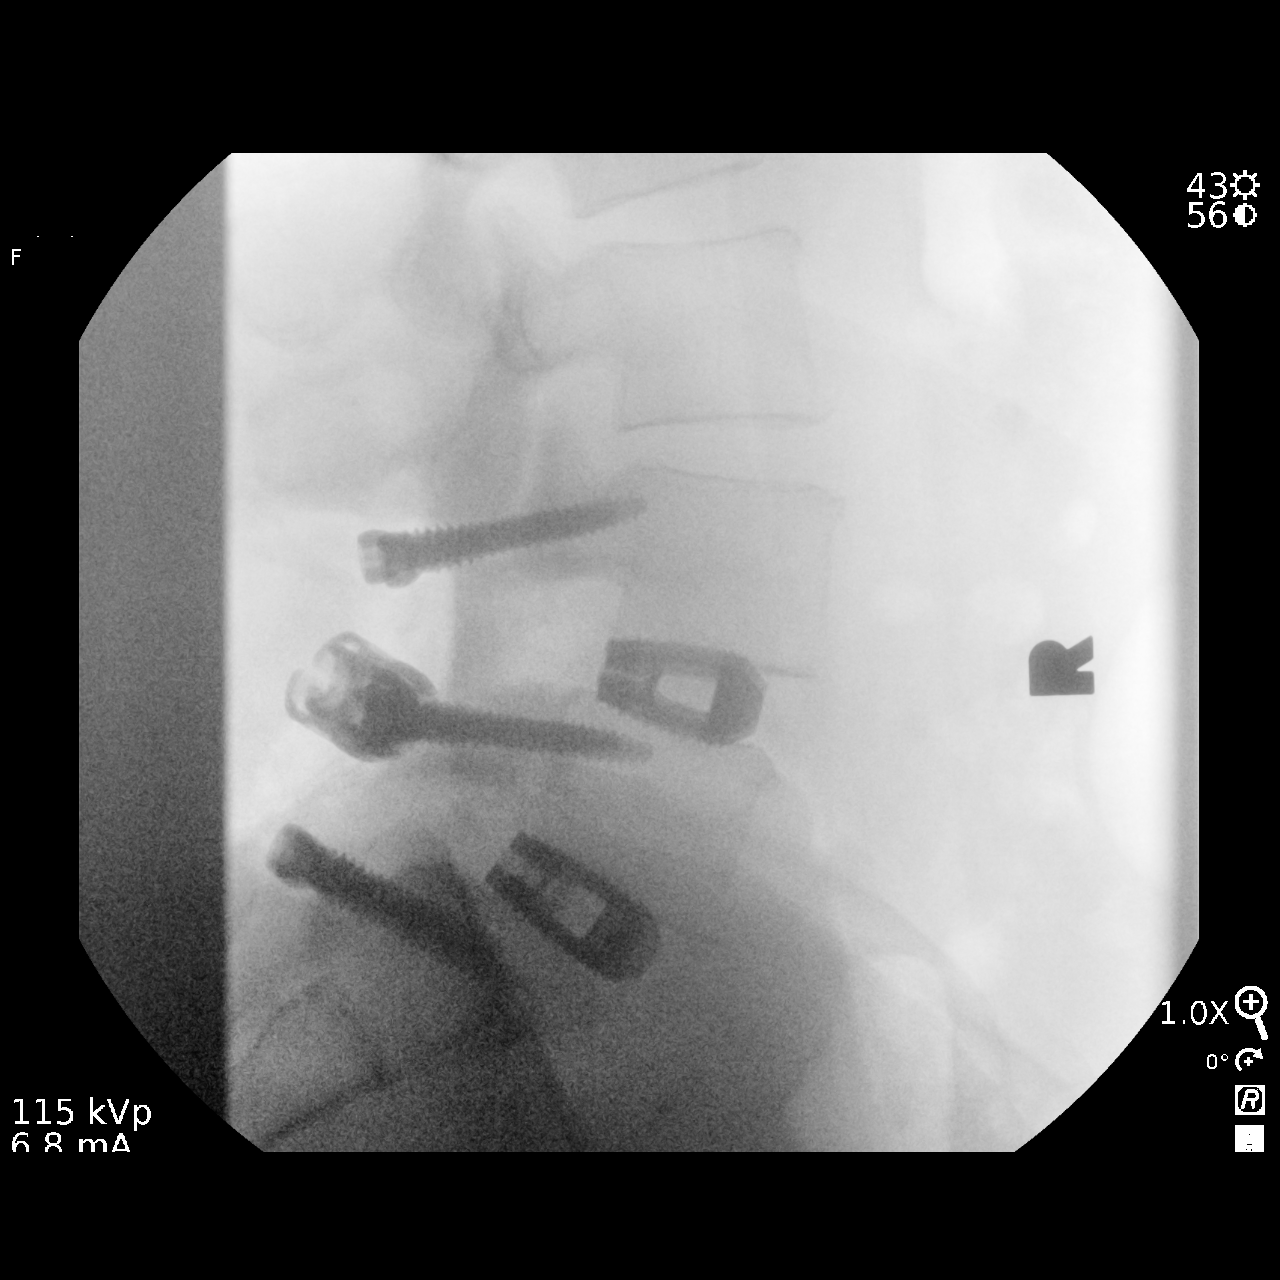
[im 2/3]
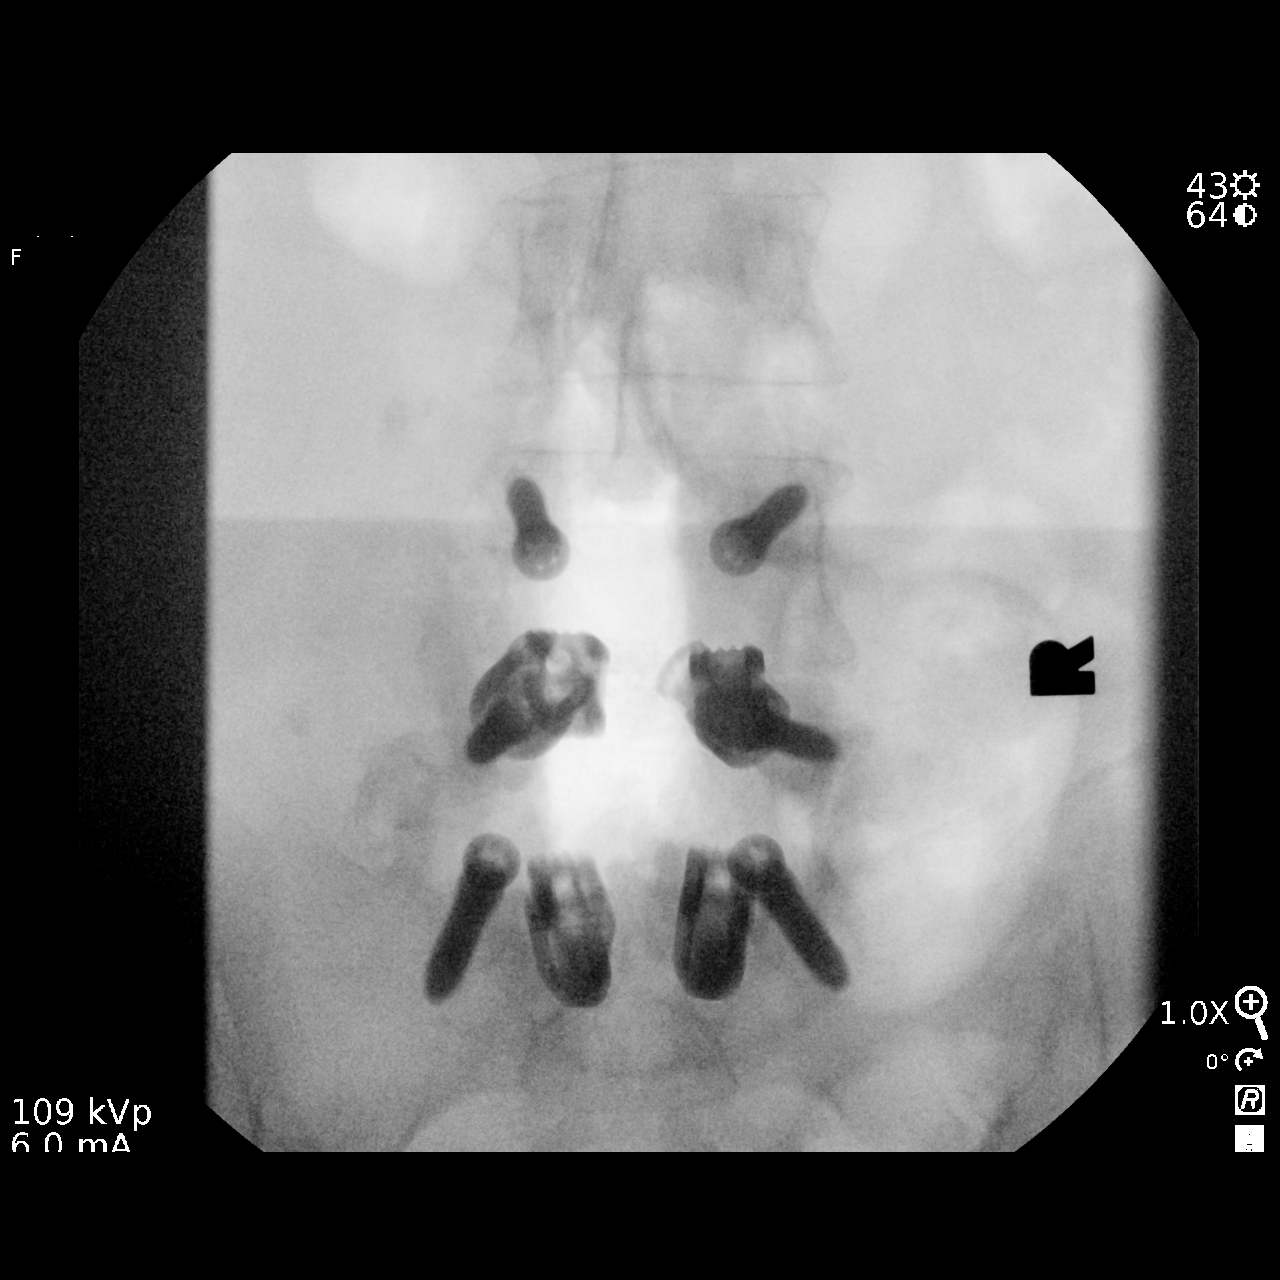
[im 3/3]
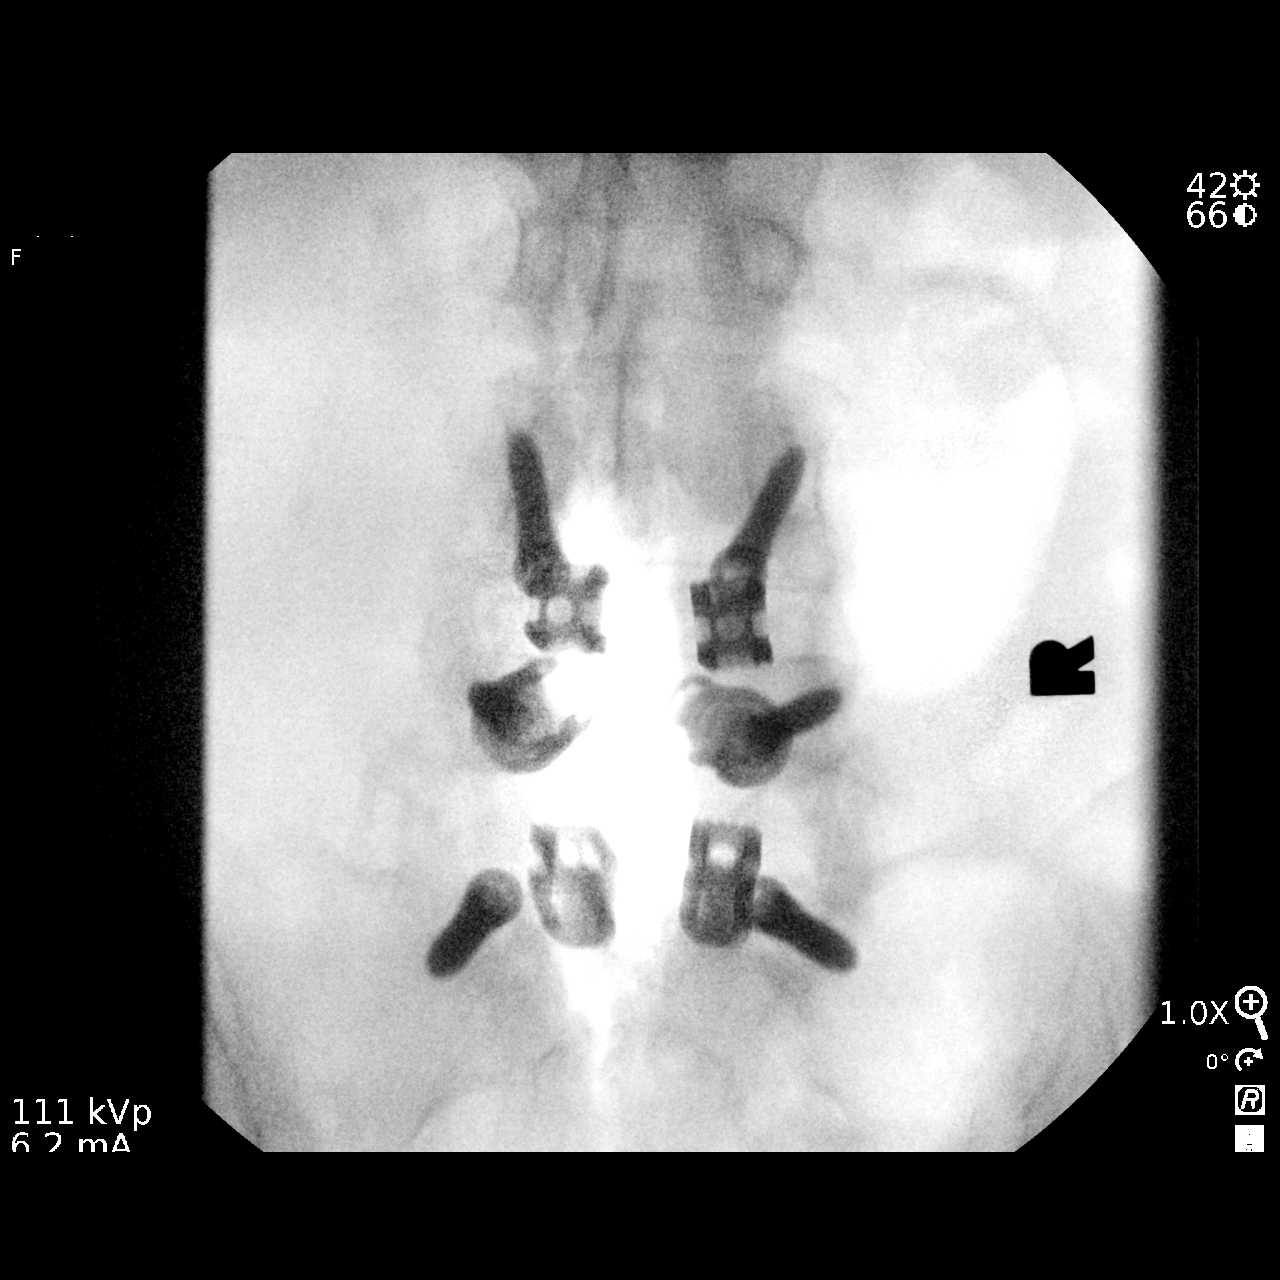

[3 of 3 positions shown; findings below may reference images not displayed]

FINDINGS: Lumbar segmentation appeared normal on the prior MRI. 3
intraoperative fluoroscopic spot views of the lower lumbar spine
demonstrate bilateral pedicle screw placement from L4-S1, and
interbody implants at L4-L5 and L5-S1. L4-L5 spondylolisthesis is
less apparent. Posterior decompression also evident.
IMPRESSION: L4-L5 and L5-S1 decompression and fusion underway.

## 2021-06-08 DIAGNOSIS — Z79899 Other long term (current) drug therapy: Secondary | ICD-10-CM | POA: Diagnosis not present

## 2021-06-08 DIAGNOSIS — L648 Other androgenic alopecia: Secondary | ICD-10-CM | POA: Diagnosis not present

## 2021-07-05 DIAGNOSIS — Z23 Encounter for immunization: Secondary | ICD-10-CM | POA: Diagnosis not present

## 2021-07-13 DIAGNOSIS — Z1389 Encounter for screening for other disorder: Secondary | ICD-10-CM | POA: Diagnosis not present

## 2021-07-13 DIAGNOSIS — E7800 Pure hypercholesterolemia, unspecified: Secondary | ICD-10-CM | POA: Diagnosis not present

## 2021-07-13 DIAGNOSIS — G4733 Obstructive sleep apnea (adult) (pediatric): Secondary | ICD-10-CM | POA: Diagnosis not present

## 2021-07-13 DIAGNOSIS — Z0000 Encounter for general adult medical examination without abnormal findings: Secondary | ICD-10-CM | POA: Diagnosis not present

## 2021-07-13 DIAGNOSIS — Z1231 Encounter for screening mammogram for malignant neoplasm of breast: Secondary | ICD-10-CM | POA: Diagnosis not present

## 2021-07-13 DIAGNOSIS — M47816 Spondylosis without myelopathy or radiculopathy, lumbar region: Secondary | ICD-10-CM | POA: Diagnosis not present

## 2021-07-13 DIAGNOSIS — Z6836 Body mass index (BMI) 36.0-36.9, adult: Secondary | ICD-10-CM | POA: Diagnosis not present

## 2021-12-14 ENCOUNTER — Other Ambulatory Visit: Payer: Self-pay | Admitting: Internal Medicine

## 2021-12-14 ENCOUNTER — Ambulatory Visit
Admission: RE | Admit: 2021-12-14 | Discharge: 2021-12-14 | Disposition: A | Discharge status: Home / Self Care | Payer: Medicare Other | Source: Ambulatory Visit | Attending: Internal Medicine | Admitting: Internal Medicine

## 2021-12-14 DIAGNOSIS — G8929 Other chronic pain: Secondary | ICD-10-CM | POA: Diagnosis not present

## 2021-12-14 DIAGNOSIS — M25552 Pain in left hip: Secondary | ICD-10-CM

## 2021-12-27 DIAGNOSIS — M25552 Pain in left hip: Secondary | ICD-10-CM | POA: Diagnosis not present

## 2021-12-27 DIAGNOSIS — R269 Unspecified abnormalities of gait and mobility: Secondary | ICD-10-CM | POA: Diagnosis not present

## 2022-01-03 DIAGNOSIS — M25552 Pain in left hip: Secondary | ICD-10-CM | POA: Diagnosis not present

## 2022-01-03 DIAGNOSIS — R269 Unspecified abnormalities of gait and mobility: Secondary | ICD-10-CM | POA: Diagnosis not present

## 2022-02-22 DIAGNOSIS — Z23 Encounter for immunization: Secondary | ICD-10-CM | POA: Diagnosis not present

## 2022-05-17 DIAGNOSIS — H26491 Other secondary cataract, right eye: Secondary | ICD-10-CM | POA: Diagnosis not present

## 2022-05-17 DIAGNOSIS — Z961 Presence of intraocular lens: Secondary | ICD-10-CM | POA: Diagnosis not present

## 2022-05-17 DIAGNOSIS — H04123 Dry eye syndrome of bilateral lacrimal glands: Secondary | ICD-10-CM | POA: Diagnosis not present

## 2022-05-17 DIAGNOSIS — H35413 Lattice degeneration of retina, bilateral: Secondary | ICD-10-CM | POA: Diagnosis not present

## 2022-06-19 DIAGNOSIS — Z23 Encounter for immunization: Secondary | ICD-10-CM | POA: Diagnosis not present

## 2022-06-29 DIAGNOSIS — Z20822 Contact with and (suspected) exposure to covid-19: Secondary | ICD-10-CM | POA: Diagnosis not present

## 2022-06-29 DIAGNOSIS — B349 Viral infection, unspecified: Secondary | ICD-10-CM | POA: Diagnosis not present

## 2022-06-29 DIAGNOSIS — R0981 Nasal congestion: Secondary | ICD-10-CM | POA: Diagnosis not present

## 2022-06-29 DIAGNOSIS — R509 Fever, unspecified: Secondary | ICD-10-CM | POA: Diagnosis not present

## 2022-06-29 DIAGNOSIS — R52 Pain, unspecified: Secondary | ICD-10-CM | POA: Diagnosis not present

## 2022-06-29 DIAGNOSIS — U071 COVID-19: Secondary | ICD-10-CM | POA: Diagnosis not present

## 2022-07-11 DIAGNOSIS — Z23 Encounter for immunization: Secondary | ICD-10-CM | POA: Diagnosis not present

## 2022-07-18 ENCOUNTER — Other Ambulatory Visit: Payer: Self-pay | Admitting: Physician Assistant

## 2022-07-18 DIAGNOSIS — G4733 Obstructive sleep apnea (adult) (pediatric): Secondary | ICD-10-CM | POA: Diagnosis not present

## 2022-07-18 DIAGNOSIS — Z1231 Encounter for screening mammogram for malignant neoplasm of breast: Secondary | ICD-10-CM

## 2022-07-18 DIAGNOSIS — Z1331 Encounter for screening for depression: Secondary | ICD-10-CM | POA: Diagnosis not present

## 2022-07-18 DIAGNOSIS — M47816 Spondylosis without myelopathy or radiculopathy, lumbar region: Secondary | ICD-10-CM | POA: Diagnosis not present

## 2022-07-18 DIAGNOSIS — E7800 Pure hypercholesterolemia, unspecified: Secondary | ICD-10-CM | POA: Diagnosis not present

## 2022-07-18 DIAGNOSIS — H6121 Impacted cerumen, right ear: Secondary | ICD-10-CM | POA: Diagnosis not present

## 2022-07-18 DIAGNOSIS — R058 Other specified cough: Secondary | ICD-10-CM | POA: Diagnosis not present

## 2022-07-18 DIAGNOSIS — J301 Allergic rhinitis due to pollen: Secondary | ICD-10-CM | POA: Diagnosis not present

## 2022-07-18 DIAGNOSIS — Z0000 Encounter for general adult medical examination without abnormal findings: Secondary | ICD-10-CM | POA: Diagnosis not present

## 2022-10-09 ENCOUNTER — Ambulatory Visit: Payer: Medicare Other

## 2022-11-27 DIAGNOSIS — L648 Other androgenic alopecia: Secondary | ICD-10-CM | POA: Diagnosis not present

## 2022-11-28 ENCOUNTER — Ambulatory Visit
Admission: RE | Admit: 2022-11-28 | Discharge: 2022-11-28 | Disposition: A | Discharge status: Home / Self Care | Payer: Medicare Other | Source: Ambulatory Visit | Attending: Physician Assistant | Admitting: Physician Assistant

## 2022-11-28 DIAGNOSIS — Z1231 Encounter for screening mammogram for malignant neoplasm of breast: Secondary | ICD-10-CM | POA: Diagnosis not present

## 2022-12-07 DIAGNOSIS — Z23 Encounter for immunization: Secondary | ICD-10-CM | POA: Diagnosis not present

## 2022-12-14 DIAGNOSIS — K648 Other hemorrhoids: Secondary | ICD-10-CM | POA: Diagnosis not present

## 2022-12-14 DIAGNOSIS — Z86010 Personal history of colonic polyps: Secondary | ICD-10-CM | POA: Diagnosis not present

## 2022-12-14 DIAGNOSIS — K635 Polyp of colon: Secondary | ICD-10-CM | POA: Diagnosis not present

## 2022-12-14 DIAGNOSIS — Z09 Encounter for follow-up examination after completed treatment for conditions other than malignant neoplasm: Secondary | ICD-10-CM | POA: Diagnosis not present

## 2022-12-14 DIAGNOSIS — K5730 Diverticulosis of large intestine without perforation or abscess without bleeding: Secondary | ICD-10-CM | POA: Diagnosis not present

## 2022-12-19 DIAGNOSIS — K635 Polyp of colon: Secondary | ICD-10-CM | POA: Diagnosis not present

## 2023-01-24 DIAGNOSIS — L648 Other androgenic alopecia: Secondary | ICD-10-CM | POA: Diagnosis not present

## 2023-03-05 DIAGNOSIS — M25551 Pain in right hip: Secondary | ICD-10-CM | POA: Diagnosis not present

## 2023-05-20 DIAGNOSIS — H26491 Other secondary cataract, right eye: Secondary | ICD-10-CM | POA: Diagnosis not present

## 2023-05-20 DIAGNOSIS — H35413 Lattice degeneration of retina, bilateral: Secondary | ICD-10-CM | POA: Diagnosis not present

## 2023-05-20 DIAGNOSIS — H04123 Dry eye syndrome of bilateral lacrimal glands: Secondary | ICD-10-CM | POA: Diagnosis not present

## 2023-05-20 DIAGNOSIS — Z961 Presence of intraocular lens: Secondary | ICD-10-CM | POA: Diagnosis not present

## 2023-05-25 DIAGNOSIS — Z23 Encounter for immunization: Secondary | ICD-10-CM | POA: Diagnosis not present

## 2023-06-27 DIAGNOSIS — Z23 Encounter for immunization: Secondary | ICD-10-CM | POA: Diagnosis not present

## 2023-07-22 ENCOUNTER — Other Ambulatory Visit: Payer: Self-pay | Admitting: Physician Assistant

## 2023-07-22 ENCOUNTER — Ambulatory Visit
Admission: RE | Admit: 2023-07-22 | Discharge: 2023-07-22 | Disposition: A | Discharge status: Home / Self Care | Payer: Medicare Other | Source: Ambulatory Visit | Attending: Physician Assistant | Admitting: Physician Assistant

## 2023-07-22 DIAGNOSIS — Z1331 Encounter for screening for depression: Secondary | ICD-10-CM | POA: Diagnosis not present

## 2023-07-22 DIAGNOSIS — M25562 Pain in left knee: Secondary | ICD-10-CM

## 2023-07-22 DIAGNOSIS — G8929 Other chronic pain: Secondary | ICD-10-CM | POA: Diagnosis not present

## 2023-07-22 DIAGNOSIS — M25561 Pain in right knee: Secondary | ICD-10-CM

## 2023-07-22 DIAGNOSIS — Z23 Encounter for immunization: Secondary | ICD-10-CM | POA: Diagnosis not present

## 2023-07-22 DIAGNOSIS — E7800 Pure hypercholesterolemia, unspecified: Secondary | ICD-10-CM | POA: Diagnosis not present

## 2023-07-22 DIAGNOSIS — G4733 Obstructive sleep apnea (adult) (pediatric): Secondary | ICD-10-CM | POA: Diagnosis not present

## 2023-07-22 DIAGNOSIS — Z0000 Encounter for general adult medical examination without abnormal findings: Secondary | ICD-10-CM | POA: Diagnosis not present

## 2023-11-04 ENCOUNTER — Other Ambulatory Visit: Payer: Self-pay | Admitting: Physician Assistant

## 2023-11-04 DIAGNOSIS — Z1231 Encounter for screening mammogram for malignant neoplasm of breast: Secondary | ICD-10-CM

## 2023-11-29 ENCOUNTER — Ambulatory Visit
Admission: RE | Admit: 2023-11-29 | Discharge: 2023-11-29 | Disposition: A | Discharge status: Home / Self Care | Payer: Medicare Other | Source: Ambulatory Visit | Attending: Physician Assistant | Admitting: Physician Assistant

## 2023-11-29 DIAGNOSIS — Z1231 Encounter for screening mammogram for malignant neoplasm of breast: Secondary | ICD-10-CM
